# Patient Record
Sex: Female | Born: 1980 | Race: White | Hispanic: No | Smoking: Former smoker
Health system: Southern US, Community
[De-identification: ages and names within clinical notes are randomized; demographics above are authoritative.]

## PROBLEM LIST (undated history)

## (undated) DIAGNOSIS — J42 Unspecified chronic bronchitis: Secondary | ICD-10-CM

## (undated) DIAGNOSIS — I951 Orthostatic hypotension: Secondary | ICD-10-CM

## (undated) DIAGNOSIS — G2581 Restless legs syndrome: Secondary | ICD-10-CM

## (undated) DIAGNOSIS — R51 Headache: Secondary | ICD-10-CM

## (undated) DIAGNOSIS — R519 Headache, unspecified: Secondary | ICD-10-CM

## (undated) DIAGNOSIS — T8859XA Other complications of anesthesia, initial encounter: Secondary | ICD-10-CM

## (undated) DIAGNOSIS — R112 Nausea with vomiting, unspecified: Secondary | ICD-10-CM

## (undated) DIAGNOSIS — T4145XA Adverse effect of unspecified anesthetic, initial encounter: Secondary | ICD-10-CM

## (undated) DIAGNOSIS — D649 Anemia, unspecified: Secondary | ICD-10-CM

## (undated) DIAGNOSIS — G4700 Insomnia, unspecified: Secondary | ICD-10-CM

## (undated) DIAGNOSIS — J45909 Unspecified asthma, uncomplicated: Secondary | ICD-10-CM

## (undated) DIAGNOSIS — Z8489 Family history of other specified conditions: Secondary | ICD-10-CM

## (undated) DIAGNOSIS — G610 Guillain-Barre syndrome: Secondary | ICD-10-CM

## (undated) DIAGNOSIS — Z9889 Other specified postprocedural states: Secondary | ICD-10-CM

## (undated) DIAGNOSIS — R0600 Dyspnea, unspecified: Secondary | ICD-10-CM

## (undated) DIAGNOSIS — H47333 Pseudopapilledema of optic disc, bilateral: Secondary | ICD-10-CM

## (undated) DIAGNOSIS — K219 Gastro-esophageal reflux disease without esophagitis: Secondary | ICD-10-CM

## (undated) DIAGNOSIS — M1990 Unspecified osteoarthritis, unspecified site: Secondary | ICD-10-CM

## (undated) HISTORY — PX: COLONOSCOPY: SHX174

## (undated) HISTORY — PX: FINGER FRACTURE SURGERY: SHX638

## (undated) HISTORY — PX: OTHER SURGICAL HISTORY: SHX169

---
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
## 2003-01-13 HISTORY — PX: KNEE ARTHROSCOPY: SUR90

## 2003-02-17 DIAGNOSIS — G610 Guillain-Barre syndrome: Secondary | ICD-10-CM

## 2003-02-17 HISTORY — DX: Guillain-Barre syndrome: G61.0

## 2007-01-13 HISTORY — PX: CHOLECYSTECTOMY: SHX55

## 2008-01-13 HISTORY — PX: TUBAL LIGATION: SHX77

## 2012-11-22 ENCOUNTER — Inpatient Hospital Stay: Payer: Self-pay | Admitting: Internal Medicine

## 2012-11-22 LAB — CBC
HCT: 38.9 % (ref 35.0–47.0)
HGB: 13.4 g/dL (ref 12.0–16.0)
Platelet: 268 10*3/uL (ref 150–440)
RBC: 4.48 10*6/uL (ref 3.80–5.20)
RDW: 13.4 % (ref 11.5–14.5)

## 2012-11-22 LAB — COMPREHENSIVE METABOLIC PANEL
Albumin: 3.5 g/dL (ref 3.4–5.0)
Anion Gap: 6 — ABNORMAL LOW (ref 7–16)
Calcium, Total: 9.2 mg/dL (ref 8.5–10.1)
Co2: 22 mmol/L (ref 21–32)
Creatinine: 0.68 mg/dL (ref 0.60–1.30)
EGFR (African American): >60
EGFR (Non-African Amer.): >60
Glucose: 103 mg/dL — ABNORMAL HIGH (ref 65–99)
Osmolality: 272 (ref 275–301)
Potassium: 3.9 mmol/L (ref 3.5–5.1)
SGOT(AST): 24 U/L (ref 15–37)

## 2012-11-23 LAB — CBC WITH DIFFERENTIAL/PLATELET
Basophil #: 0 10*3/uL (ref 0.0–0.1)
Basophil %: 0.1 %
Eosinophil %: 0 %
MCH: 29.8 pg (ref 26.0–34.0)
MCHC: 34 g/dL (ref 32.0–36.0)
Monocyte #: 0.2 x10 3/mm (ref 0.2–0.9)
Monocyte %: 0.9 %
Neutrophil %: 94.5 %
RBC: 4.47 10*6/uL (ref 3.80–5.20)
RDW: 13.4 % (ref 11.5–14.5)
WBC: 17.4 10*3/uL — ABNORMAL HIGH (ref 3.6–11.0)

## 2012-11-23 LAB — BASIC METABOLIC PANEL
Anion Gap: 10 (ref 7–16)
Chloride: 108 mmol/L — ABNORMAL HIGH (ref 98–107)
EGFR (African American): >60
Glucose: 131 mg/dL — ABNORMAL HIGH (ref 65–99)
Osmolality: 276 (ref 275–301)
Sodium: 138 mmol/L (ref 136–145)

## 2012-11-23 LAB — RAPID INFLUENZA A&B ANTIGENS

## 2013-01-10 ENCOUNTER — Emergency Department: Payer: Self-pay | Admitting: Emergency Medicine

## 2013-08-09 ENCOUNTER — Emergency Department: Payer: Self-pay | Admitting: Internal Medicine

## 2013-12-18 ENCOUNTER — Emergency Department: Payer: Self-pay | Admitting: Emergency Medicine

## 2014-05-02 ENCOUNTER — Ambulatory Visit
Admit: 2014-05-02 | Disposition: A | Discharge status: Home / Self Care | Payer: Self-pay | Attending: Orthopedic Surgery | Admitting: Orthopedic Surgery

## 2014-05-04 NOTE — Discharge Summary (Signed)
PATIENT NAME:  Lisa Sharp, Mariadelcarmen E MR#:  [id number] DATE OF BIRTH:  Dec 07, 1980  DATE OF ADMISSION:  11/22/2012 DATE OF DISCHARGE:  11/26/2012  PRIMARY CARE PHYSICIAN: At the [facility].   FINAL DIAGNOSES: 1.  Acute respiratory failure, improved.  2.  Asthmatic bronchitis.  3.  Palpitations.  4.  Tobacco abuse.   MEDICATIONS ON DISCHARGE: Include Zantac 150 mg daily, albuterol/ipratropium 3 mL nebulizer every 6 hours as needed for shortness of breath, albuterol CFC 2 puffs 4 times a day as needed; prednisone 10 mg 4 tablets day 1, 3 tablets day 2, 2 tablets day 3, 1 tablet day 4 and 5, 1/2 tablets day 6 and 7; Flovent CFC 1 inhalation twice a day, 100 mcg per inhalations; Levaquin 500 mg 1 tablet every 24 hours for 2 more days, then stop.   HOME OXYGEN: No.   DIET: Regular, regular consistency.   ACTIVITY: As tolerated.   FOLLOWUP: In 1 to 2 weeks at the [facility].   HOSPITAL COURSE: The patient was admitted November 22, 2012, discharged November 26, 2012. The patient came in with shortness of breath, was admitted with acute respiratory failure and asthma exacerbation, started on IV Levaquin, IV Solu-Medrol, and nebulizer treatments.   Laboratory and radiological data during the hospital course included an EKG that showed normal sinus rhythm, no acute ST-T wave changes. Troponin negative. White blood cell count 16.2, hemoglobin and hematocrit 13.4 and 38.9, platelet count of 268. Glucose 103, BUN 8, creatinine 0.68, sodium 137, potassium 3.9, chloride 109, CO2 of 22, calcium 9.2. Liver function tests: Normal range. Chest x-ray negative. ABG showed a pH of 7.41, pCO2 of 32, pO2 of 58, bicarbonate 20.3, O2 saturation 91.6. EKG showed a sinus tachycardia. Magnesium 1.8. Influenza A and B were negative.   HOSPITAL COURSE PER PROBLEM LIST:  1.  For the patient's acute respiratory failure, the patient's lungs upon discharge did show a slight expiratory wheeze in the left base. The rest of  the lung fields were clear. The patient was moving better air than on presentation. The patient was off oxygen, and pulse oximetry held at 92% with exertion on room air. The patient had improved. The patient did require oxygen through the previous hospital course.  2.  For the patient's asthmatic bronchitis, the patient was started on IV Levaquin and high-dose Solu-Medrol and nebulizer treatments. The patient will finish up the course of Levaquin, prednisone taper, and started on Flovent.  3.  Palpitations, likely secondary to difficulty breathing.  4.  Tobacco abuse. The patient was counseled on admission about smoking cessation.   TIME SPENT ON DISCHARGE: 40 minutes.   ____________________________ Herschell Dimesichard J. Renae GlossWieting, MD rjw:jcm D: 11/26/2012 14:00:00 ET T: 11/26/2012 19:51:17 ET JOB#: [id number]  cc: Herschell Dimesichard J. Renae GlossWieting, MD, <Dictator> [facility]   Salley ScarletICHARD J Shamia Uppal MD ELECTRONICALLY SIGNED 11/29/2012 18:53

## 2014-05-04 NOTE — H&P (Signed)
PATIENT NAME:  Lisa Sharp, Cameron E MR#:  [id number] DATE OF BIRTH:  Aug 30, 1980  DATE OF ADMISSION:  11/22/2012  REFERRING PHYSICIAN: Dr. Sharma CovertNorman.   PRIMARY CARE PHYSICIAN: None.   CHIEF COMPLAINT: Shortness of breath.   HISTORY OF PRESENT ILLNESS: The patient is a pleasant, obese 34 year old with chronic bronchitis and history of asthma who has not followed up with a physician here for a while. She has run out of most medications. The patient previously lived in [place] and, as she has no insurance, used to get some samples from her previous clinic.   For the last couple of weeks, she has been having upper respiratory symptoms including sinus pressure or cough. The last 3 days, she has been having some chills and progressive shortness of breath and some dizziness. She has a daughter and father who have similar symptoms. She has had significant dry cough. It feels wet but she does not bring anything up.   She came into the hospital and she was found to be extensively wheezing. She has been hypoxic with ambulation and she is very dyspneic. Sats have been in the mid 80s with ambulation per ER physician. She appears to have no pneumonia on x-ray of the chest, and hospitalist services were contacted for further evaluation and management.   PAST MEDICAL HISTORY:  1.  Asthma and chronic bronchitis.  2.  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   PAST SURGICAL HISTORY: Left knee surgery, gallbladder taken out, tubal ligation.   ALLERGIES: ASPIRIN.   FAMILY HISTORY: Multiple, including asthma, emphysema, cancers, hypertension.   SOCIAL HISTORY: Smokes a pack a day. Occasional alcohol. No drugs. Is a Consulting civil engineerstudent.   OUTPATIENT MEDICATIONS: She has run out of most of her medications, but she takes albuterol p.r.n., nebs p.r.n. and Zantac 150 mg daily. She is not taking Advair or steroids as they have run out.   REVIEW OF SYSTEMS:   CONSTITUTIONAL: Positive for chills. Weight is steady.  EYES: No blurry vision or double vision.  ENT: No tinnitus. Positive for sore throat from all the coughing. Has no decreased hearing or postnasal drip.  RESPIRATORY: Positive for dry cough, wheezing, shortness of breath and dyspnea on exertion. Has a history of asthma and chronic bronchitis.  CARDIOVASCULAR: Denies chest pain or swelling in the legs, but she does feel more short of breath when she is lying flat.  GASTROINTESTINAL: No nausea, vomiting, diarrhea, abdominal pain, or hematemesis.  GENITOURINARY: Denies dysuria or hematuria.  HEMOLYMPHATIC: No anemia or easy bruising.  SKIN: No rashes.  MUSCULOSKELETAL: Denies arthritis or gout.  NEUROLOGIC: Has history of Guillain-Barre. PSYCHIATRIC: No anxiety or insomnia.   PHYSICAL EXAMINATION:  VITAL SIGNS: Temperature on arrival 98.1, pulse rate 93, respiratory rate 24, blood pressure 108/75, oxygen saturation 95% on room air when sitting still, but it dropped to mid-80s or so to high 80s with ambulation or any minimal exertion.  GENERAL: An obese female sitting in bed, mildly short of breath.  HEENT: Normocephalic, atraumatic. Pupils are equal and reactive. Anicteric sclerae. Extraocular muscles intact. Moist mucous membranes.  NECK: Supple. No thyroid tenderness. No cervical lymphadenopathy.  CARDIOVASCULAR: S1, S2. Irregularly irregular. No murmurs, rubs or gallops.  LUNGS: Bilateral diffuse wheezing and decreased breath sounds bilaterally, decreased air entry.  ABDOMEN: Soft, nontender, nondistended, obese. No organomegaly appreciated.  EXTREMITIES: No significant lower extremity edema.  SKIN: No obvious rashes.  NEUROLOGIC: Cranial nerves II through XII grossly  intact. Strength is five out of five in all extremities. Sensation intact to light touch.  PSYCHIATRIC: Awake, alert, oriented x3.   LABORATORIES: Glucose 103, BUN 8, creatinine 0.68, sodium 137, potassium 3.9. LFTs  within normal limits. Troponin negative. White count 16.2, hemoglobin 13.4, platelets 268. ABG showed pH of 7.41, pCO2 of 32, pO2 of 58 on room air. X-ray of the chest, PA and lateral: No acute cardiopulmonary disease of the chest.   EKG: Sinus rhythm. Rate is 88. No acute ST elevations or depressions.   ASSESSMENT AND PLAN: We have a 34 year old female with history of asthma and chronic bronchitis, who is not on any inhaled steroids, who presents with acute respiratory failure and systemic inflammatory response syndrome, likely from asthma exacerbation and acute bronchitis. At this point, we will admit the patient to the hospital. The patient did desaturate to 85% on room air with slight ambulation with tachypnea, rate of 30s, and requires oxygen at this point. I would start her on standing IV steroids, standing nebulizers, oxygen, and for the acute bronchitis, start her on Levaquin, obtain sputum cultures. The patient does have a couple of sick contacts. I will go ahead and send in rapid flu, but she has had no significant fevers.   At this point, we will also start her on some Robitussin with codeine and Tylenol p.r.n. for her symptoms of coughing. The patient did have leukocytosis, tachycardia, and tachypnea, and no pneumonia.   I suspect she has acute bronchitis. She does still smoke tobacco and she was counseled for 3 minutes. She refuses a patch at this time and I do not know if she is going to stop smoking, but she was strongly encouraged by me.   We would monitor her white count and her symptoms and also add p.r.n. nebulizers in case she needs it.   TOTAL TIME SPENT: 50 minutes.   CODE STATUS: FULL CODE.   ____________________________ Krystal Eaton, MD sa:np D: 11/22/2012 16:17:37 ET T: 11/22/2012 17:15:28 ET JOB#: [id number]  cc: Krystal Eaton, MD, <Dictator> Krystal Eaton MD ELECTRONICALLY SIGNED 12/06/2012 15:27

## 2014-06-06 ENCOUNTER — Other Ambulatory Visit: Payer: Self-pay

## 2014-06-08 DIAGNOSIS — K219 Gastro-esophageal reflux disease without esophagitis: Secondary | ICD-10-CM | POA: Diagnosis not present

## 2014-06-08 DIAGNOSIS — M67432 Ganglion, left wrist: Secondary | ICD-10-CM | POA: Diagnosis not present

## 2014-06-08 DIAGNOSIS — H47333 Pseudopapilledema of optic disc, bilateral: Secondary | ICD-10-CM | POA: Diagnosis not present

## 2014-06-08 DIAGNOSIS — Z800 Family history of malignant neoplasm of digestive organs: Secondary | ICD-10-CM | POA: Diagnosis not present

## 2014-06-08 DIAGNOSIS — Z79899 Other long term (current) drug therapy: Secondary | ICD-10-CM | POA: Diagnosis not present

## 2014-06-08 DIAGNOSIS — Z888 Allergy status to other drugs, medicaments and biological substances status: Secondary | ICD-10-CM | POA: Diagnosis not present

## 2014-06-08 DIAGNOSIS — Z8261 Family history of arthritis: Secondary | ICD-10-CM | POA: Diagnosis not present

## 2014-06-08 DIAGNOSIS — Z7951 Long term (current) use of inhaled steroids: Secondary | ICD-10-CM | POA: Diagnosis not present

## 2014-06-08 DIAGNOSIS — M6740 Ganglion, unspecified site: Secondary | ICD-10-CM | POA: Diagnosis present

## 2014-06-08 DIAGNOSIS — Z886 Allergy status to analgesic agent status: Secondary | ICD-10-CM | POA: Diagnosis not present

## 2014-06-08 DIAGNOSIS — Z8042 Family history of malignant neoplasm of prostate: Secondary | ICD-10-CM | POA: Diagnosis not present

## 2014-06-08 DIAGNOSIS — Z9049 Acquired absence of other specified parts of digestive tract: Secondary | ICD-10-CM | POA: Diagnosis not present

## 2014-06-08 DIAGNOSIS — J45909 Unspecified asthma, uncomplicated: Secondary | ICD-10-CM | POA: Diagnosis not present

## 2014-06-08 DIAGNOSIS — Z818 Family history of other mental and behavioral disorders: Secondary | ICD-10-CM | POA: Diagnosis not present

## 2014-06-08 DIAGNOSIS — G43909 Migraine, unspecified, not intractable, without status migrainosus: Secondary | ICD-10-CM | POA: Diagnosis not present

## 2014-06-08 DIAGNOSIS — Z823 Family history of stroke: Secondary | ICD-10-CM | POA: Diagnosis not present

## 2014-06-08 DIAGNOSIS — Z833 Family history of diabetes mellitus: Secondary | ICD-10-CM | POA: Diagnosis not present

## 2014-06-08 DIAGNOSIS — Z803 Family history of malignant neoplasm of breast: Secondary | ICD-10-CM | POA: Diagnosis not present

## 2014-06-08 DIAGNOSIS — Z8489 Family history of other specified conditions: Secondary | ICD-10-CM | POA: Diagnosis not present

## 2014-06-08 DIAGNOSIS — Z8669 Personal history of other diseases of the nervous system and sense organs: Secondary | ICD-10-CM | POA: Diagnosis not present

## 2014-06-08 DIAGNOSIS — Z808 Family history of malignant neoplasm of other organs or systems: Secondary | ICD-10-CM | POA: Diagnosis not present

## 2014-06-08 DIAGNOSIS — Z8249 Family history of ischemic heart disease and other diseases of the circulatory system: Secondary | ICD-10-CM | POA: Diagnosis not present

## 2014-06-08 NOTE — Patient Instructions (Signed)
  Your procedure is scheduled on: Jun 12, 2014 Report to Same Day Surgery To find out your arrival time please call [phone] between 1PM - 3PM on today.  Remember: Instructions that are not followed completely may result in serious medical risk, up to and including death, or upon the discretion of your surgeon and anesthesiologist your surgery may need to be rescheduled.    __x__ 1. Do not eat food or drink liquids after midnight. No gum chewing or hard candies.     __x__ 2. No Alcohol for 24 hours before or after surgery.   ____ 3. Bring all medications with you on the day of surgery if instructed.    __x__ 4. Notify your doctor if there is any change in your medical condition     (cold, fever, infections).     Do not wear jewelry, make-up, hairpins, clips or nail polish.  Do not wear lotions, powders, or perfumes. You may wear deodorant.  Do not shave 48 hours prior to surgery. Men may shave face and neck.  Do not bring valuables to the hospital.    [facility] is not responsible for any belongings or valuables.               Contacts, dentures or bridgework may not be worn into surgery.  Leave your suitcase in the car. After surgery it may be brought to your room.  For patients admitted to the hospital, discharge time is determined by your  treatment team.   Patients discharged the day of surgery will not be allowed to drive home.   Please read over the following fact sheets that you were given:      __x__ Take these medicines the morning of surgery with A SIP OF WATER:    1. Gabapentin  2. omeprazole (PRILOSEC)   ____ Fleet Enema (as directed)   ____ Use CHG Soap as directed  _x___ Use inhalers on the day of surgery  ____ Stop metformin 2 days prior to surgery    ____ Take 1/2 of usual insulin dose the night before surgery and none on the morning of surgery.   ____ Stop Coumadin/Plavix/aspirin on does not apply  _x___ Stop Anti-inflammatories on May 29, 2014.  Tylenol OK for pain.   ____ Stop supplements until after surgery.    ____ Bring C-Pap to the hospital.

## 2014-06-12 ENCOUNTER — Ambulatory Visit: Payer: BLUE CROSS/BLUE SHIELD | Admitting: Certified Registered Nurse Anesthetist

## 2014-06-12 ENCOUNTER — Encounter
Admission: RE | Disposition: A | Discharge status: Home / Self Care | Payer: Self-pay | Source: Ambulatory Visit | Attending: Orthopedic Surgery

## 2014-06-12 ENCOUNTER — Encounter: Payer: Self-pay | Admitting: *Deleted

## 2014-06-12 ENCOUNTER — Ambulatory Visit
Admission: RE | Admit: 2014-06-12 | Discharge: 2014-06-12 | Disposition: A | Discharge status: Home / Self Care | Payer: BLUE CROSS/BLUE SHIELD | Source: Ambulatory Visit | Attending: Orthopedic Surgery | Admitting: Orthopedic Surgery

## 2014-06-12 DIAGNOSIS — Z833 Family history of diabetes mellitus: Secondary | ICD-10-CM | POA: Insufficient documentation

## 2014-06-12 DIAGNOSIS — Z803 Family history of malignant neoplasm of breast: Secondary | ICD-10-CM | POA: Insufficient documentation

## 2014-06-12 DIAGNOSIS — Z886 Allergy status to analgesic agent status: Secondary | ICD-10-CM | POA: Insufficient documentation

## 2014-06-12 DIAGNOSIS — G43909 Migraine, unspecified, not intractable, without status migrainosus: Secondary | ICD-10-CM | POA: Insufficient documentation

## 2014-06-12 DIAGNOSIS — Z8249 Family history of ischemic heart disease and other diseases of the circulatory system: Secondary | ICD-10-CM | POA: Insufficient documentation

## 2014-06-12 DIAGNOSIS — Z888 Allergy status to other drugs, medicaments and biological substances status: Secondary | ICD-10-CM | POA: Insufficient documentation

## 2014-06-12 DIAGNOSIS — Z7951 Long term (current) use of inhaled steroids: Secondary | ICD-10-CM | POA: Insufficient documentation

## 2014-06-12 DIAGNOSIS — Z79899 Other long term (current) drug therapy: Secondary | ICD-10-CM | POA: Insufficient documentation

## 2014-06-12 DIAGNOSIS — J45909 Unspecified asthma, uncomplicated: Secondary | ICD-10-CM | POA: Insufficient documentation

## 2014-06-12 DIAGNOSIS — Z808 Family history of malignant neoplasm of other organs or systems: Secondary | ICD-10-CM | POA: Insufficient documentation

## 2014-06-12 DIAGNOSIS — Z823 Family history of stroke: Secondary | ICD-10-CM | POA: Insufficient documentation

## 2014-06-12 DIAGNOSIS — M67432 Ganglion, left wrist: Secondary | ICD-10-CM | POA: Insufficient documentation

## 2014-06-12 DIAGNOSIS — Z8669 Personal history of other diseases of the nervous system and sense organs: Secondary | ICD-10-CM | POA: Insufficient documentation

## 2014-06-12 DIAGNOSIS — K219 Gastro-esophageal reflux disease without esophagitis: Secondary | ICD-10-CM | POA: Insufficient documentation

## 2014-06-12 DIAGNOSIS — Z8042 Family history of malignant neoplasm of prostate: Secondary | ICD-10-CM | POA: Insufficient documentation

## 2014-06-12 DIAGNOSIS — Z800 Family history of malignant neoplasm of digestive organs: Secondary | ICD-10-CM | POA: Insufficient documentation

## 2014-06-12 DIAGNOSIS — Z9049 Acquired absence of other specified parts of digestive tract: Secondary | ICD-10-CM | POA: Insufficient documentation

## 2014-06-12 DIAGNOSIS — Z818 Family history of other mental and behavioral disorders: Secondary | ICD-10-CM | POA: Insufficient documentation

## 2014-06-12 DIAGNOSIS — Z8261 Family history of arthritis: Secondary | ICD-10-CM | POA: Insufficient documentation

## 2014-06-12 DIAGNOSIS — H47333 Pseudopapilledema of optic disc, bilateral: Secondary | ICD-10-CM | POA: Insufficient documentation

## 2014-06-12 DIAGNOSIS — Z8489 Family history of other specified conditions: Secondary | ICD-10-CM | POA: Insufficient documentation

## 2014-06-12 HISTORY — PX: GANGLION CYST EXCISION: SHX1691

## 2014-06-12 HISTORY — DX: Other specified postprocedural states: Z98.890

## 2014-06-12 HISTORY — DX: Other complications of anesthesia, initial encounter: T88.59XA

## 2014-06-12 HISTORY — DX: Headache, unspecified: R51.9

## 2014-06-12 HISTORY — DX: Other specified postprocedural states: R11.2

## 2014-06-12 HISTORY — DX: Adverse effect of unspecified anesthetic, initial encounter: T41.45XA

## 2014-06-12 HISTORY — DX: Insomnia, unspecified: G47.00

## 2014-06-12 HISTORY — DX: Restless legs syndrome: G25.81

## 2014-06-12 HISTORY — DX: Unspecified osteoarthritis, unspecified site: M19.90

## 2014-06-12 HISTORY — DX: Family history of other specified conditions: Z84.89

## 2014-06-12 HISTORY — DX: Guillain-Barre syndrome: G61.0

## 2014-06-12 HISTORY — DX: Unspecified asthma, uncomplicated: J45.909

## 2014-06-12 HISTORY — DX: Pseudopapilledema of optic disc, bilateral: H47.333

## 2014-06-12 HISTORY — DX: Gastro-esophageal reflux disease without esophagitis: K21.9

## 2014-06-12 HISTORY — DX: Unspecified chronic bronchitis: J42

## 2014-06-12 HISTORY — DX: Headache: R51

## 2014-06-12 HISTORY — DX: Orthostatic hypotension: I95.1

## 2014-06-12 LAB — POCT PREGNANCY, URINE: PREG TEST UR: NEGATIVE

## 2014-06-12 SURGERY — EXCISION, GANGLION CYST, WRIST
Anesthesia: General | Laterality: Left | Wound class: Clean

## 2014-06-12 MED ORDER — ONDANSETRON HCL 4 MG PO TABS: 4.0000 mg | ORAL_TABLET | Freq: Four times a day (QID) | ORAL | Status: DC | PRN

## 2014-06-12 MED ORDER — ONDANSETRON HCL 4 MG/2ML IJ SOLN
4.0000 mg | Freq: Once | INTRAMUSCULAR | Status: AC | PRN
  Administered 2014-06-12: 4 mg via INTRAVENOUS

## 2014-06-12 MED ORDER — FENTANYL CITRATE (PF) 100 MCG/2ML IJ SOLN
25.0000 ug | INTRAMUSCULAR | Status: DC | PRN
  Administered 2014-06-12 (×4): 25 ug via INTRAVENOUS

## 2014-06-12 MED ORDER — ROCURONIUM BROMIDE 100 MG/10ML IV SOLN
INTRAVENOUS | Status: DC | PRN
  Administered 2014-06-12: 30 mg via INTRAVENOUS
  Administered 2014-06-12: 10 mg via INTRAVENOUS

## 2014-06-12 MED ORDER — IPRATROPIUM-ALBUTEROL 0.5-2.5 (3) MG/3ML IN SOLN
RESPIRATORY_TRACT | Status: AC
  Administered 2014-06-12: 3 mL via RESPIRATORY_TRACT
  Filled 2014-06-12: qty 3

## 2014-06-12 MED ORDER — ONDANSETRON HCL 4 MG/2ML IJ SOLN
INTRAMUSCULAR | Status: DC | PRN
  Administered 2014-06-12: 4 mg via INTRAVENOUS

## 2014-06-12 MED ORDER — BUPIVACAINE HCL (PF) 0.5 % IJ SOLN
INTRAMUSCULAR | Status: AC
  Filled 2014-06-12: qty 30

## 2014-06-12 MED ORDER — ALBUTEROL SULFATE HFA 108 (90 BASE) MCG/ACT IN AERS
INHALATION_SPRAY | RESPIRATORY_TRACT | Status: DC | PRN
  Administered 2014-06-12: 2 via RESPIRATORY_TRACT

## 2014-06-12 MED ORDER — PROPOFOL 10 MG/ML IV BOLUS
INTRAVENOUS | Status: DC | PRN
Start: 2014-06-12 — End: 2014-06-12
  Administered 2014-06-12: 200 mg via INTRAVENOUS

## 2014-06-12 MED ORDER — LACTATED RINGERS IV SOLN
INTRAVENOUS | Status: DC
  Administered 2014-06-12 (×2): via INTRAVENOUS

## 2014-06-12 MED ORDER — SUGAMMADEX SODIUM 200 MG/2ML IV SOLN
INTRAVENOUS | Status: DC | PRN
  Administered 2014-06-12: 200 mg via INTRAVENOUS

## 2014-06-12 MED ORDER — IPRATROPIUM-ALBUTEROL 0.5-2.5 (3) MG/3ML IN SOLN
3.0000 mL | Freq: Once | RESPIRATORY_TRACT | Status: AC
  Administered 2014-06-12: 3 mL via RESPIRATORY_TRACT

## 2014-06-12 MED ORDER — MIDAZOLAM HCL 2 MG/2ML IJ SOLN
INTRAMUSCULAR | Status: DC | PRN
  Administered 2014-06-12: 2 mg via INTRAVENOUS

## 2014-06-12 MED ORDER — FENTANYL CITRATE (PF) 100 MCG/2ML IJ SOLN
INTRAMUSCULAR | Status: DC | PRN
  Administered 2014-06-12: 100 ug via INTRAVENOUS

## 2014-06-12 MED ORDER — SODIUM CHLORIDE 0.9 % IV SOLN: INTRAVENOUS | Status: DC

## 2014-06-12 MED ORDER — HYDROCODONE-ACETAMINOPHEN 5-325 MG PO TABS
ORAL_TABLET | ORAL | Status: AC
  Filled 2014-06-12: qty 1

## 2014-06-12 MED ORDER — ONDANSETRON 4 MG PO TBDP: 4.0000 mg | ORAL_TABLET | Freq: Three times a day (TID) | ORAL | Status: DC | PRN

## 2014-06-12 MED ORDER — PROMETHAZINE HCL 25 MG/ML IJ SOLN
6.2500 mg | Freq: Once | INTRAMUSCULAR | Status: AC
  Administered 2014-06-12: 6.25 mg via INTRAVENOUS

## 2014-06-12 MED ORDER — PROMETHAZINE HCL 25 MG/ML IJ SOLN
INTRAMUSCULAR | Status: AC
  Administered 2014-06-12: 6.25 mg via INTRAVENOUS
  Filled 2014-06-12: qty 1

## 2014-06-12 MED ORDER — BUPIVACAINE HCL 0.5 % IJ SOLN
INTRAMUSCULAR | Status: DC | PRN
  Administered 2014-06-12: 10 mL

## 2014-06-12 MED ORDER — HYDROCODONE-ACETAMINOPHEN 5-325 MG PO TABS
ORAL_TABLET | ORAL | Status: AC
  Administered 2014-06-12: 1 via ORAL
  Filled 2014-06-12: qty 1

## 2014-06-12 MED ORDER — SODIUM CHLORIDE 0.9 % IJ SOLN
INTRAMUSCULAR | Status: AC
  Filled 2014-06-12: qty 10

## 2014-06-12 MED ORDER — ONDANSETRON HCL 4 MG/2ML IJ SOLN
INTRAMUSCULAR | Status: AC
  Administered 2014-06-12: 4 mg via INTRAVENOUS
  Filled 2014-06-12: qty 2

## 2014-06-12 MED ORDER — HYDROCODONE-ACETAMINOPHEN 5-325 MG PO TABS: 1.0000 | ORAL_TABLET | Freq: Four times a day (QID) | ORAL | Status: DC | PRN

## 2014-06-12 MED ORDER — LIDOCAINE HCL (CARDIAC) 20 MG/ML IV SOLN
INTRAVENOUS | Status: DC | PRN
  Administered 2014-06-12: 50 mg via INTRAVENOUS

## 2014-06-12 MED ORDER — SUCCINYLCHOLINE CHLORIDE 20 MG/ML IJ SOLN
INTRAMUSCULAR | Status: DC | PRN
  Administered 2014-06-12: 120 mg via INTRAVENOUS

## 2014-06-12 MED ORDER — ONDANSETRON HCL 4 MG/2ML IJ SOLN: 4.0000 mg | Freq: Four times a day (QID) | INTRAMUSCULAR | Status: DC | PRN

## 2014-06-12 MED ORDER — HYDROCODONE-ACETAMINOPHEN 5-325 MG PO TABS
1.0000 | ORAL_TABLET | ORAL | Status: DC | PRN
  Administered 2014-06-12 (×2): 1 via ORAL

## 2014-06-12 MED ORDER — HYDROCODONE-ACETAMINOPHEN 5-325 MG PO TABS: 1.0000 | ORAL_TABLET | ORAL | Status: DC | PRN

## 2014-06-12 MED ORDER — FENTANYL CITRATE (PF) 100 MCG/2ML IJ SOLN
INTRAMUSCULAR | Status: AC
  Administered 2014-06-12: 25 ug via INTRAVENOUS
  Filled 2014-06-12: qty 2

## 2014-06-12 MED ORDER — DEXAMETHASONE SODIUM PHOSPHATE 4 MG/ML IJ SOLN
INTRAMUSCULAR | Status: DC | PRN
  Administered 2014-06-12: 5 mg via INTRAVENOUS

## 2014-06-12 SURGICAL SUPPLY — 28 items
BANDAGE ELASTIC 3 CLIP NS LF (GAUZE/BANDAGES/DRESSINGS) ×3 IMPLANT
BNDG ESMARK 4X12 TAN STRL LF (GAUZE/BANDAGES/DRESSINGS) IMPLANT
CANISTER SUCT 1200ML W/VALVE (MISCELLANEOUS) ×3 IMPLANT
CAST PADDING 3X4FT ST 30246 (SOFTGOODS) ×2
CHLORAPREP W/TINT 26ML (MISCELLANEOUS) ×3 IMPLANT
ELECT CAUTERY NEEDLE 2.0 MIC (NEEDLE) ×3 IMPLANT
GAUZE PETRO XEROFOAM 1X8 (MISCELLANEOUS) ×3 IMPLANT
GAUZE SPONGE 4X4 12PLY STRL (GAUZE/BANDAGES/DRESSINGS) ×3 IMPLANT
GLOVE BIOGEL PI IND STRL 9 (GLOVE) ×1 IMPLANT
GLOVE BIOGEL PI INDICATOR 9 (GLOVE) ×2
GLOVE SURG ORTHO 9.0 STRL STRW (GLOVE) ×3 IMPLANT
GOWN SPECIALTY ULTRA XL (MISCELLANEOUS) ×3 IMPLANT
GOWN STRL REUS W/ TWL LRG LVL3 (GOWN DISPOSABLE) ×1 IMPLANT
GOWN STRL REUS W/TWL LRG LVL3 (GOWN DISPOSABLE) ×2
KIT RM TURNOVER STRD PROC AR (KITS) ×3 IMPLANT
NDL SAFETY 25GX1.5 (NEEDLE) ×3 IMPLANT
NS IRRIG 500ML POUR BTL (IV SOLUTION) ×3 IMPLANT
PACK EXTREMITY ARMC (MISCELLANEOUS) ×3 IMPLANT
PAD CAST CTTN 3X4 STRL (SOFTGOODS) ×1 IMPLANT
SPLINT CAST 1 STEP 3X12 (MISCELLANEOUS) ×3 IMPLANT
STOCKINETTE STRL 4IN 9604848 (GAUZE/BANDAGES/DRESSINGS) ×3 IMPLANT
SUT ETHILON 4-0 (SUTURE) ×2
SUT ETHILON 4-0 FS2 18XMFL BLK (SUTURE) ×1
SUT ETHILON 5-0 FS-2 18 BLK (SUTURE) IMPLANT
SUT MNCRL 4-0 (SUTURE)
SUT MNCRL 4-0 27XMFL (SUTURE)
SUTURE ETHLN 4-0 FS2 18XMF BLK (SUTURE) ×1 IMPLANT
SUTURE MNCRL 4-0 27XMF (SUTURE) IMPLANT

## 2014-06-12 NOTE — Op Note (Signed)
06/12/2014  8:06 AM  PATIENT:  Ethlyn GalleryLeslie E Gutkowski  34 y.o. female  PRE-OPERATIVE DIAGNOSIS:  GANGLION CYST  POST-OPERATIVE DIAGNOSIS:  GANGLION CYST  PROCEDURE:  Procedure(s): REMOVAL GANGLION OF WRIST (Left)  SURGEON: Leitha SchullerMichael J Leopoldo Mazzie, MD  ASSISTANTS: None  ANESTHESIA:   general  EBL:    minimal  BLOOD ADMINISTERED:none  DRAINS: none   LOCAL MEDICATIONS USED:  BUPIVICAINE   SPECIMEN:  Source of Specimen:  Volar wrist ganglion cyst  DISPOSITION OF SPECIMEN:  PATHOLOGY  COUNTS:  YES  TOURNIQUET:   8 minutes at 250 mmHg   IMPLANTS: None  DICTATION: .Dragon Dictation patient was brought to the operating room and after adequate anesthesia was obtained the left arm was prepped and draped in sterile fashion. After patient identification and timeout procedures were completed tourniquet was raised to 250 mmHg incision was made over the FCR tendon and the ganglion and a slight curvilinear approach. The subcutaneous tissue was spread and the ganglion came up to this surface more pain in the proximal extent of the wound this was then tracked distally to the volar radiocarpal joint. The stalk of the ganglion was then cut at the level of the joint, and ganglion cyst wall removed a specimen. Cautery was used around the opening in the joint to try to cause scar tissue and prevent recurrence. At this point the wound was irrigated and tourniquet let down. There is no significant bleeding, radial artery was intact. The wounds closed with simple interrupted 4-0 nylon skin suture. Xeroform 4 x 4's web roll and volar splint were applied with 10 cc of half percent Sensorcaine infiltrated around the incision prior to dressing.  PLAN OF CARE: Discharge to home after PACU  PATIENT DISPOSITION:  PACU - hemodynamically stable.

## 2014-06-12 NOTE — Anesthesia Preprocedure Evaluation (Addendum)
Anesthesia Evaluation  Patient identified by MRN, date of birth, ID band Patient awake    Reviewed: Allergy & Precautions, NPO status , Patient's Chart, lab work & pertinent test results  History of Anesthesia Complications (+) PONV and Family history of anesthesia reaction  Airway Mallampati: II  TM Distance: >3 FB Neck ROM: Full    Dental  (+) Poor Dentition, Chipped   Pulmonary asthma , Current Smoker,    Pulmonary exam normal       Cardiovascular Normal cardiovascular exam    Neuro/Psych  Headaches,  Neuromuscular disease    GI/Hepatic GERD-  Medicated and Controlled,  Endo/Other    Renal/GU      Musculoskeletal  (+) Arthritis -, Osteoarthritis,    Abdominal (+) + obese,   Peds  Hematology   Anesthesia Other Findings   Reproductive/Obstetrics                            Anesthesia Physical Anesthesia Plan  ASA: III  Anesthesia Plan: General   Post-op Pain Management:    Induction: Intravenous  Airway Management Planned: Oral ETT  Additional Equipment:   Intra-op Plan:   Post-operative Plan: Extubation in OR  Informed Consent: I have reviewed the patients History and Physical, chart, labs and discussed the procedure including the risks, benefits and alternatives for the proposed anesthesia with the patient or authorized representative who has indicated his/her understanding and acceptance.   Dental advisory given  Plan Discussed with: CRNA and Surgeon  Anesthesia Plan Comments:         Anesthesia Quick Evaluation

## 2014-06-12 NOTE — Transfer of Care (Signed)
Immediate Anesthesia Transfer of Care Note  Patient: Lisa Sharp  Procedure(s) Performed: Procedure(s): REMOVAL GANGLION OF WRIST (Left)  Patient Location: PACU  Anesthesia Type:General  Level of Consciousness: Alert, Awake, Oriented  Airway & Oxygen Therapy: Patient Spontanous Breathing, Reactive airway, DUONEB gioven in PACU per Dr. Noralyn Pickarroll order  Post-op Assessment: Report given to RN  Post vital signs: Reviewed and stable  Last Vitals:  Filed Vitals:   06/12/14 0818  BP: 127/88  Pulse: 94  Temp: 36.2 C  Resp: 21    Complications: No apparent anesthesia complications

## 2014-06-12 NOTE — Discharge Instructions (Addendum)
Keep arm elevated over the next 2-3 days. Local anesthesia was placed around her incision, there may be numbness into the hand for several hours. Loosen Ace wrap if fingers swellGeneral Anesthesia, Care After Refer to this sheet in the next few weeks. These instructions provide you with information on caring for yourself after your procedure. Your health care provider may also give you more specific instructions. Your treatment has been planned according to current medical practices, but problems sometimes occur. Call your health care provider if you have any problems or questions after your procedure. WHAT TO EXPECT AFTER THE PROCEDURE After the procedure, it is typical to experience:  Sleepiness.  Nausea and vomiting. HOME CARE INSTRUCTIONS  For the first 24 hours after general anesthesia:  Have a responsible person with you.  Do not drive a car. If you are alone, do not take public transportation.  Do not drink alcohol.  Do not take medicine that has not been prescribed by your health care provider.  Do not sign important papers or make important decisions.  You may resume a normal diet and activities as directed by your health care provider.  Change bandages (dressings) as directed.  If you have questions or problems that seem related to general anesthesia, call the hospital and ask for the anesthetist or anesthesiologist on call. SEEK MEDICAL CARE IF:  You have nausea and vomiting that continue the day after anesthesia.  You develop a rash. SEEK IMMEDIATE MEDICAL CARE IF:   You have difficulty breathing.  You have chest pain.  You have any allergic problems. Document Released: 04/06/2000 Document Revised: 01/03/2013 Document Reviewed: 07/14/2012 [facility] Patient Information 2015 [place], [place]. This information is not intended to replace advice given to you by your health care provider. Make sure you discuss any questions you have with your health care  provider. Ganglion Cyst A ganglion cyst is a noncancerous, fluid-filled lump that occurs near joints or tendons. The ganglion cyst grows out of a joint or the lining of a tendon. It most often develops in the hand or wrist but can also develop in the shoulder, elbow, hip, knee, ankle, or foot. The round or oval ganglion can be pea sized or larger than a grape. Increased activity may enlarge the size of the cyst because more fluid starts to build up.  CAUSES  It is not completely known what causes a ganglion cyst to grow. However, it may be related to:  Inflammation or irritation around the joint.  An injury.  Repetitive movements or overuse.  Arthritis. SYMPTOMS  A lump most often appears in the hand or wrist, but can occur in other areas of the body. Generally, the lump is painless without other symptoms. However, sometimes pain can be felt during activity or when pressure is applied to the lump. The lump may even be tender to the touch. Tingling, pain, numbness, or muscle weakness can occur if the ganglion cyst presses on a nerve. Your grip may be weak and you may have less movement in your joints.  DIAGNOSIS  Ganglion cysts are most often diagnosed based on a physical exam, noting where the cyst is and how it looks. Your caregiver will feel the lump and may shine a light alongside it. If it is a ganglion, a light often shines through it. Your caregiver may order an X-ray, ultrasound, or MRI to rule out other conditions. TREATMENT  Ganglions usually go away on their own without treatment. If pain or other symptoms are involved, treatment may  be needed. Treatment is also needed if the ganglion limits your movement or if it gets infected. Treatment options include:  Wearing a wrist or finger brace or splint.  Taking anti-inflammatory medicine.  Draining fluid from the lump with a needle (aspiration).  Injecting a steroid into the joint.  Surgery to remove the ganglion cyst and its stalk  that is attached to the joint or tendon. However, ganglion cysts can grow back. HOME CARE INSTRUCTIONS   Do not press on the ganglion, poke it with a needle, or hit it with a heavy object. You may rub the lump gently and often. Sometimes fluid moves out of the cyst.  Only take medicines as directed by your caregiver.  Wear your brace or splint as directed by your caregiver. SEEK MEDICAL CARE IF:   Your ganglion becomes larger or more painful.  You have increased redness, red streaks, or swelling.  You have pus coming from the lump.  You have weakness or numbness in the affected area. MAKE SURE YOU:   Understand these instructions.  Will watch your condition.  Will get help right away if you are not doing well or get worse. Document Released: 12/27/1999 Document Revised: 09/23/2011 Document Reviewed: 02/22/2007 [facility] Patient Information 2015 [place]. This information is not intended to replace advice given to you by your health care provider. Make sure you discuss any questions you have with your health care provider.

## 2014-06-12 NOTE — Anesthesia Procedure Notes (Signed)
Procedure Name: Intubation Date/Time: 06/12/2014 7:21 AM Performed by: Sherron FlemingsHARVEY, Clarice Zulauf Pre-anesthesia Checklist: Patient identified, Patient being monitored, Timeout performed, Emergency Drugs available and Suction available Patient Re-evaluated:Patient Re-evaluated prior to inductionOxygen Delivery Method: Circle system utilized Preoxygenation: Pre-oxygenation with 100% oxygen Intubation Type: IV induction and Rapid sequence Ventilation: Mask ventilation without difficulty Laryngoscope Size: Mac and 3 Grade View: Grade II Tube type: Oral Tube size: 7.5 mm Number of attempts: 1 Placement Confirmation: ETT inserted through vocal cords under direct vision,  positive ETCO2 and breath sounds checked- equal and bilateral Secured at: 21 cm Tube secured with: Tape Dental Injury: Teeth and Oropharynx as per pre-operative assessment  Future Recommendations: Recommend- induction with short-acting agent, and alternative techniques readily available

## 2014-06-12 NOTE — Anesthesia Postprocedure Evaluation (Signed)
  Anesthesia Post-op Note  Patient: Lisa Sharp  Procedure(s) Performed: Procedure(s): REMOVAL GANGLION OF WRIST (Left)  Anesthesia type:General  Patient location: PACU  Post pain: Pain level controlled  Post assessment: Post-op Vital signs reviewed, Patient's Cardiovascular Status Stable, Respiratory Function Stable, Patent Airway and No signs of Nausea or vomiting  Post vital signs: Reviewed and stable  Last Vitals:  Filed Vitals:   06/12/14 0959  BP: 119/65  Pulse: 83  Temp:   Resp: 16    Level of consciousness: awake, alert  and patient cooperative  Complications: No apparent anesthesia complications

## 2014-06-12 NOTE — H&P (Signed)
Reviewed paper H+P, will be scanned into chart. No changes noted.  

## 2014-06-13 LAB — SURGICAL PATHOLOGY

## 2014-08-15 ENCOUNTER — Encounter: Payer: Self-pay | Admitting: *Deleted

## 2014-08-15 NOTE — Discharge Instructions (Signed)
T & A INSTRUCTION SHEET - [facility] °[facility] ° °CREIGHTON VAUGHT, MD °PAUL H. JUENGEL, MD  °P. SCOTT BENNETT °CHAPMAN MCQUEEN, MD ° °[address] [place], [place] [address] TEL. [phone] °[address] [place] [address] [phone] ° °INFORMATION SHEET FOR A TONSILLECTOMY AND ADENDOIDECTOMY ° °About Your Tonsils and Adenoids ° The tonsils and adenoids are normal body tissues that are part of our immune system.  They normally help to protect us against diseases that may enter our mouth and nose.  However, sometimes the tonsils and/or adenoids become too large and obstruct our breathing, especially at night. °  ° If either of these things happen it helps to remove the tonsils and adenoids in order to become healthier. The operation to remove the tonsils and adenoids is called a tonsillectomy and adenoidectomy. ° °The Location of Your Tonsils and Adenoids ° The tonsils are located in the back of the throat on both side and sit in a cradle of muscles. The adenoids are located in the roof of the mouth, behind the nose, and closely associated with the opening of the Eustachian tube to the ear. ° °Surgery on Tonsils and Adenoids ° A tonsillectomy and adenoidectomy is a short operation which takes about thirty minutes.  This includes being put to sleep and being awakened.  Tonsillectomies and adenoidectomies are performed at [facility] and may require observation period in the recovery room prior to going home. ° °Following the Operation for a Tonsillectomy ° A cautery machine is used to control bleeding.  Bleeding from a tonsillectomy and adenoidectomy is minimal and postoperatively the risk of bleeding is approximately four percent, although this rarely life threatening. ° ° ° °After your tonsillectomy and adenoidectomy post-op care at home: ° °1. Our patients are able to go home the same day.  You may be given prescriptions for pain  medications and antibiotics, if indicated. °2. It is extremely important to remember that fluid intake is of utmost importance after a tonsillectomy.  The amount that you drink must be maintained in the postoperative period.  A good indication of whether a child is getting enough fluid is whether his/her urine output is constant.  As long as children are urinating or wetting their diaper every 6 - 8 hours this is usually enough fluid intake.   °3. Although rare, this is a risk of some bleeding in the first ten days after surgery.  This is usually occurs between day five and nine postoperatively.  This risk of bleeding is approximately four percent.  If you or your child should have any bleeding you should remain calm and notify our office or go directly to the Emergency Room at  [facility] where they will contact us. Our doctors are available seven days a week for notification.  We recommend sitting up quietly in a chair, place an ice pack on the front of the neck and spitting out the blood gently until we are able to contact you.  Adults should gargle gently with ice water and this may help stop the bleeding.  If the bleeding does not stop after a short time, i.e. 10 to 15 minutes, or seems to be increasing again, please contact us or go to the hospital.   °4. It is common for the pain to be worse at 5 - 7 days postoperatively.  This occurs because the “scab” is peeling off and the mucous membrane (skin of   the throat) is growing back where the tonsils were.   °5. It is common for a low-grade fever, less than 102, during the first week after a tonsillectomy and adenoidectomy.  It is usually due to not drinking enough liquids, and we suggest your use liquid Tylenol or the pain medicine with Tylenol prescribed in order to keep your temperature below 102.  Please follow the directions on the back of the bottle. °6. Do not take aspirin or any products that contain aspirin such as Bufferin, Anacin,  Ecotrin, aspirin gum, Goodies, BC headache powders, etc., after a T&A because it can promote bleeding.  Please check with our office before administering any other medication that may been prescribed by other doctors during the two week post-operative period. °7. If you happen to look in the mirror or into your child’s mouth you will see white/gray patches on the back of the throat.  This is what a scab looks like in the mouth and is normal after having a T&A.  It will disappear once the tonsil area heals completely. However, it may cause a noticeable odor, and this too will disappear with time.  Warm salt water gargles may be used to keep the throat clean and promote healing.   °8. You or your child may experience ear pain after having a T&A.  This is called referred pain and comes from the throat, but it is felt in the ears.  Ear pain is quite common and expected.  It will usually go away after ten days.  There is usually nothing wrong with the ears, and it is primarily due to the healing area stimulating the nerve to the ear that runs along the side of the throat.  Use either the prescribed pain medicine or Tylenol as needed.  °9. The throat tissues after a tonsillectomy are obviously sensitive.  Smoking around children who have had a tonsillectomy significantly increases the risk of bleeding.  DO NOT SMOKE!  ° °General Anesthesia, Care After °Refer to this sheet in the next few weeks. These instructions provide you with information on caring for yourself after your procedure. Your health care provider may also give you more specific instructions. Your treatment has been planned according to current medical practices, but problems sometimes occur. Call your health care provider if you have any problems or questions after your procedure. °WHAT TO EXPECT AFTER THE PROCEDURE °After the procedure, it is typical to experience: °· Sleepiness. °· Nausea and vomiting. °HOME CARE INSTRUCTIONS °· For the first 24 hours  after general anesthesia: °¨ Have a responsible person with you. °¨ Do not drive a car. If you are alone, do not take public transportation. °¨ Do not drink alcohol. °¨ Do not take medicine that has not been prescribed by your health care provider. °¨ Do not sign important papers or make important decisions. °¨ You may resume a normal diet and activities as directed by your health care provider. °· Change bandages (dressings) as directed. °· If you have questions or problems that seem related to general anesthesia, call the hospital and ask for the anesthetist or anesthesiologist on call. °SEEK MEDICAL CARE IF: °· You have nausea and vomiting that continue the day after anesthesia. °· You develop a rash. °SEEK IMMEDIATE MEDICAL CARE IF:  °· You have difficulty breathing. °· You have chest pain. °· You have any allergic problems. °Document Released: 04/06/2000 Document Revised: 01/03/2013 Document Reviewed: 07/14/2012 °ExitCare® Patient Information ©2015 [employer]. This information is not intended to   replace advice given to you by your health care provider. Make sure you discuss any questions you have with your health care provider. ° °

## 2014-08-17 ENCOUNTER — Encounter
Admission: RE | Disposition: A | Discharge status: Home / Self Care | Payer: Self-pay | Source: Ambulatory Visit | Attending: Unknown Physician Specialty

## 2014-08-17 ENCOUNTER — Encounter: Payer: Self-pay | Admitting: Anesthesiology

## 2014-08-17 ENCOUNTER — Ambulatory Visit: Payer: BLUE CROSS/BLUE SHIELD | Admitting: Anesthesiology

## 2014-08-17 ENCOUNTER — Ambulatory Visit
Admission: RE | Admit: 2014-08-17 | Discharge: 2014-08-17 | Disposition: A | Discharge status: Home / Self Care | Payer: BLUE CROSS/BLUE SHIELD | Source: Ambulatory Visit | Attending: Unknown Physician Specialty | Admitting: Unknown Physician Specialty

## 2014-08-17 DIAGNOSIS — Z886 Allergy status to analgesic agent status: Secondary | ICD-10-CM | POA: Insufficient documentation

## 2014-08-17 DIAGNOSIS — J45909 Unspecified asthma, uncomplicated: Secondary | ICD-10-CM | POA: Insufficient documentation

## 2014-08-17 DIAGNOSIS — Z888 Allergy status to other drugs, medicaments and biological substances status: Secondary | ICD-10-CM | POA: Insufficient documentation

## 2014-08-17 DIAGNOSIS — Z8261 Family history of arthritis: Secondary | ICD-10-CM | POA: Diagnosis not present

## 2014-08-17 DIAGNOSIS — J3503 Chronic tonsillitis and adenoiditis: Secondary | ICD-10-CM | POA: Insufficient documentation

## 2014-08-17 DIAGNOSIS — R51 Headache: Secondary | ICD-10-CM | POA: Insufficient documentation

## 2014-08-17 DIAGNOSIS — Z809 Family history of malignant neoplasm, unspecified: Secondary | ICD-10-CM | POA: Diagnosis not present

## 2014-08-17 DIAGNOSIS — Z79899 Other long term (current) drug therapy: Secondary | ICD-10-CM | POA: Diagnosis not present

## 2014-08-17 DIAGNOSIS — Z833 Family history of diabetes mellitus: Secondary | ICD-10-CM | POA: Insufficient documentation

## 2014-08-17 DIAGNOSIS — M1990 Unspecified osteoarthritis, unspecified site: Secondary | ICD-10-CM | POA: Diagnosis not present

## 2014-08-17 DIAGNOSIS — Z8489 Family history of other specified conditions: Secondary | ICD-10-CM | POA: Diagnosis not present

## 2014-08-17 DIAGNOSIS — H47333 Pseudopapilledema of optic disc, bilateral: Secondary | ICD-10-CM | POA: Diagnosis not present

## 2014-08-17 DIAGNOSIS — Z6841 Body Mass Index (BMI) 40.0 and over, adult: Secondary | ICD-10-CM | POA: Diagnosis not present

## 2014-08-17 DIAGNOSIS — F17210 Nicotine dependence, cigarettes, uncomplicated: Secondary | ICD-10-CM | POA: Diagnosis not present

## 2014-08-17 DIAGNOSIS — Z7951 Long term (current) use of inhaled steroids: Secondary | ICD-10-CM | POA: Diagnosis not present

## 2014-08-17 HISTORY — PX: TONSILLECTOMY AND ADENOIDECTOMY: SHX28

## 2014-08-17 SURGERY — TONSILLECTOMY AND ADENOIDECTOMY
Anesthesia: General | Laterality: Bilateral | Wound class: Clean Contaminated

## 2014-08-17 MED ORDER — HYDROMORPHONE HCL 1 MG/ML IJ SOLN
0.5000 mg | INTRAMUSCULAR | Status: DC | PRN
  Administered 2014-08-17: 0.5 mg via INTRAVENOUS

## 2014-08-17 MED ORDER — ACETAMINOPHEN 10 MG/ML IV SOLN
1000.0000 mg | Freq: Once | INTRAVENOUS | Status: AC
  Administered 2014-08-17: 1000 mg via INTRAVENOUS

## 2014-08-17 MED ORDER — SUCCINYLCHOLINE CHLORIDE 20 MG/ML IJ SOLN
INTRAMUSCULAR | Status: DC | PRN
  Administered 2014-08-17: 100 mg via INTRAVENOUS

## 2014-08-17 MED ORDER — PROMETHAZINE HCL 25 MG/ML IJ SOLN
12.5000 mg | Freq: Once | INTRAMUSCULAR | Status: AC | PRN
  Administered 2014-08-17: 12.5 mg via INTRAVENOUS

## 2014-08-17 MED ORDER — HYDROCODONE-ACETAMINOPHEN 7.5-325 MG/15ML PO SOLN: 15.0000 mL | ORAL | Status: DC | PRN

## 2014-08-17 MED ORDER — DEXAMETHASONE SODIUM PHOSPHATE 4 MG/ML IJ SOLN
INTRAMUSCULAR | Status: DC | PRN
  Administered 2014-08-17: 10 mg via INTRAVENOUS

## 2014-08-17 MED ORDER — LIDOCAINE HCL (CARDIAC) 20 MG/ML IV SOLN
INTRAVENOUS | Status: DC | PRN
  Administered 2014-08-17: 50 mg via INTRAVENOUS

## 2014-08-17 MED ORDER — LACTATED RINGERS IV SOLN
INTRAVENOUS | Status: DC
  Administered 2014-08-17: 10:00:00 via INTRAVENOUS

## 2014-08-17 MED ORDER — PROPOFOL 10 MG/ML IV BOLUS
INTRAVENOUS | Status: DC | PRN
  Administered 2014-08-17: 200 mg via INTRAVENOUS
  Administered 2014-08-17: 70 mg via INTRAVENOUS

## 2014-08-17 MED ORDER — FENTANYL CITRATE (PF) 100 MCG/2ML IJ SOLN
INTRAMUSCULAR | Status: DC | PRN
  Administered 2014-08-17: 100 ug via INTRAVENOUS

## 2014-08-17 MED ORDER — OXYCODONE HCL 5 MG/5ML PO SOLN
5.0000 mg | Freq: Once | ORAL | Status: AC | PRN
  Administered 2014-08-17: 5 mg via ORAL

## 2014-08-17 MED ORDER — OXYCODONE HCL 5 MG PO TABS: 5.0000 mg | ORAL_TABLET | Freq: Once | ORAL | Status: AC | PRN

## 2014-08-17 MED ORDER — BUPIVACAINE HCL (PF) 0.5 % IJ SOLN
INTRAMUSCULAR | Status: DC | PRN
  Administered 2014-08-17: 8 mL

## 2014-08-17 MED ORDER — MIDAZOLAM HCL 5 MG/5ML IJ SOLN
INTRAMUSCULAR | Status: DC | PRN
  Administered 2014-08-17: 2 mg via INTRAVENOUS

## 2014-08-17 MED ORDER — GLYCOPYRROLATE 0.2 MG/ML IJ SOLN
INTRAMUSCULAR | Status: DC | PRN
  Administered 2014-08-17: 0.1 mg via INTRAVENOUS

## 2014-08-17 MED ORDER — ONDANSETRON HCL 4 MG/2ML IJ SOLN
INTRAMUSCULAR | Status: DC | PRN
  Administered 2014-08-17: 4 mg via INTRAVENOUS

## 2014-08-17 SURGICAL SUPPLY — 20 items
CANISTER SUCT 1200ML W/VALVE (MISCELLANEOUS) ×3 IMPLANT
CATH RUBBER RED 8F (CATHETERS) ×3 IMPLANT
COAG SUCT 10F 3.5MM HAND CTRL (MISCELLANEOUS) ×3 IMPLANT
DRAPE HEAD BAR (DRAPES) ×3 IMPLANT
ELECT CAUTERY BLADE TIP 2.5 (TIP) ×3
ELECTRODE CAUTERY BLDE TIP 2.5 (TIP) ×1 IMPLANT
GLOVE BIO SURGEON STRL SZ7.5 (GLOVE) ×3 IMPLANT
HANDLE SUCTION POOLE (INSTRUMENTS) ×1 IMPLANT
NEEDLE HYPO 25GX1X1/2 BEV (NEEDLE) ×3 IMPLANT
NS IRRIG 500ML POUR BTL (IV SOLUTION) ×3 IMPLANT
PACK TONSIL/ADENOIDS (PACKS) ×3 IMPLANT
PAD GROUND ADULT SPLIT (MISCELLANEOUS) ×3 IMPLANT
PENCIL ELECTRO HAND CTR (MISCELLANEOUS) ×3 IMPLANT
SOL ANTI-FOG 6CC FOG-OUT (MISCELLANEOUS) ×1 IMPLANT
SOL FOG-OUT ANTI-FOG 6CC (MISCELLANEOUS) ×2
SPONGE TONSIL 7/8 RF SGL LF (GAUZE/BANDAGES/DRESSINGS) ×3 IMPLANT
STRAP BODY AND KNEE 60X3 (MISCELLANEOUS) ×3 IMPLANT
SUCTION POOLE HANDLE (INSTRUMENTS) ×3
SYR 5ML LL (SYRINGE) ×3 IMPLANT
SYRINGE 10CC LL (SYRINGE) IMPLANT

## 2014-08-17 NOTE — Anesthesia Preprocedure Evaluation (Signed)
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;  Pseudopapilledema of both optic discs;    negative psych ROS   GI/Hepatic Neg liver ROS, GERD-  Controlled,  Endo/Other  Morbid obesity  Renal/GU negative Renal ROS  negative genitourinary   Musculoskeletal  (+) Arthritis -,   Abdominal   Peds  Hematology negative hematology ROS (+)   Anesthesia Other Findings   Reproductive/Obstetrics negative OB ROS                             Anesthesia Physical Anesthesia Plan  ASA: II  Anesthesia Plan: General ETT   Post-op Pain Management:    Induction:   Airway Management Planned:   Additional Equipment:   Intra-op Plan:   Post-operative Plan:   Informed Consent: I have reviewed the patients History and Physical, chart, labs and discussed the procedure including the risks, benefits and alternatives for the proposed anesthesia with the patient or authorized representative who has indicated his/her understanding and acceptance.     Plan Discussed with: CRNA  Anesthesia Plan Comments:         Anesthesia Quick Evaluation

## 2014-08-17 NOTE — Transfer of Care (Signed)
Immediate Anesthesia Transfer of Care Note  Patient: Lisa Sharp  Procedure(s) Performed: Procedure(s): TONSILLECTOMY AND ADENOIDECTOMY (Bilateral)  Patient Location: PACU  Anesthesia Type: General ETT  Level of Consciousness: awake, alert  and patient cooperative  Airway and Oxygen Therapy: Patient Spontanous Breathing and Patient connected to supplemental oxygen  Post-op Assessment: Post-op Vital signs reviewed, Patient's Cardiovascular Status Stable, Respiratory Function Stable, Patent Airway and No signs of Nausea or vomiting  Post-op Vital Signs: Reviewed and stable  Complications: No apparent anesthesia complications

## 2014-08-17 NOTE — Anesthesia Postprocedure Evaluation (Signed)
  Anesthesia Post-op Note  Patient: Lisa Sharp  Procedure(s) Performed: Procedure(s): TONSILLECTOMY AND ADENOIDECTOMY (Bilateral)  Anesthesia type:General ETT  Patient location: PACU  Post pain: Pain level controlled  Post assessment: Post-op Vital signs reviewed, Patient's Cardiovascular Status Stable, Respiratory Function Stable, Patent Airway and No signs of Nausea or vomiting  Post vital signs: Reviewed and stable  Last Vitals:  Filed Vitals:   08/17/14 1214  BP:   Pulse: 83  Temp:   Resp: 16    Level of consciousness: awake, alert  and patient cooperative  Complications: No apparent anesthesia complications

## 2014-08-17 NOTE — H&P (Signed)
  H+P  Reviewed and will be scanned in later. No changes noted. 

## 2014-08-17 NOTE — Transfer of Care (Signed)
Immediate Anesthesia Transfer of Care Note  Patient: Lisa Sharp  Procedure(s) Performed: Procedure(s): TONSILLECTOMY AND ADENOIDECTOMY (Bilateral)  Patient Location: PACU  Anesthesia Type: General ETT  Level of Consciousness: awake, alert  and patient cooperative  Airway and Oxygen Therapy: Patient Spontanous Breathing and Patient connected to supplemental oxygen  Post-op Assessment: Post-op Vital signs reviewed, Patient's Cardiovascular Status Stable, Respiratory Function Stable, Patent Airway and No signs of Nausea or vomiting  Post-op Vital Signs: Reviewed and stable  Complications: No apparent anesthesia complications  

## 2014-08-17 NOTE — Op Note (Signed)
PREOPERATIVE DIAGNOSIS:  CHRONIC T AND J35.01  POSTOPERATIVE DIAGNOSIS: Same  OPERATION:  Tonsillectomy and adenoidectomy.  SURGEON:  Davina Pokeeen, MD  ANESTHESIA:  General endotracheal.  OPERATIVE FINDINGS:  Large tonsils and adenoids.  DESCRIPTION OF THE PROCEDURE:  Lisa Sharp was identified in the holding area and taken to the operating room and placed in the supine position.  After general endotracheal anesthesia, the table was turned 45 degrees and the patient was draped in the usual fashion for a tonsillectomy.  A mouth gag was inserted into the oral cavity and examination of the oropharynx showed the uvula was non-bifid.  There was no evidence of submucous cleft to the palate.  There were large tonsils.  A red rubber catheter was placed through the nostril.  Examination of the nasopharynx showed large obstructing adenoids.  Under indirect vision with the mirror, an adenotome was placed in the nasopharynx.  The adenoids were curetted free.  Reinspection with a mirror showed excellent removal of the adenoid.  Nasopharyngeal packs were then placed.  The operation then turned to the tonsillectomy.  Beginning on the left-hand side a tenaculum was used to grasp the tonsil and the Bovie cautery was used to dissect it free from the fossa.  In a similar fashion, the right tonsil was removed.  Meticulous hemostasis was achieved using the Bovie cautery.  With both tonsils removed and no active bleeding, the nasopharyngeal packs were removed.  Suction cautery was then used to cauterize the nasopharyngeal bed to prevent bleeding.  The red rubber catheter was removed with no active bleeding.  0.5% plain Marcaine was used to inject the anterior and posterior tonsillar pillars bilaterally.  A total of 8 was used.  The patient tolerated the procedure well and was awakened in the operating room and taken to the recovery room in stable condition.   CULTURES:  None.  SPECIMENS:  Tonsils and  adenoids.  ESTIMATED BLOOD LOSS:  Less than 20 ml.  [name]  08/17/2014  11:19 AM

## 2014-08-17 NOTE — Anesthesia Procedure Notes (Signed)
Procedure Name: Intubation Date/Time: 08/17/2014 11:00 AM Performed by: Jimmy Picket Pre-anesthesia Checklist: Patient identified, Emergency Drugs available, Suction available, Patient being monitored and Timeout performed Patient Re-evaluated:Patient Re-evaluated prior to inductionOxygen Delivery Method: Circle system utilized Preoxygenation: Pre-oxygenation with 100% oxygen Intubation Type: IV induction Ventilation: Mask ventilation without difficulty Laryngoscope Size: Miller and 2 Grade View: Grade I Tube type: Oral Rae Tube size: 7.0 mm Number of attempts: 1 Placement Confirmation: ETT inserted through vocal cords under direct vision,  positive ETCO2 and breath sounds checked- equal and bilateral Tube secured with: Tape Dental Injury: Teeth and Oropharynx as per pre-operative assessment

## 2014-08-20 ENCOUNTER — Encounter: Payer: Self-pay | Admitting: Unknown Physician Specialty

## 2014-08-21 LAB — SURGICAL PATHOLOGY

## 2014-12-16 ENCOUNTER — Emergency Department: Payer: Self-pay

## 2014-12-16 ENCOUNTER — Encounter: Payer: Self-pay | Admitting: Emergency Medicine

## 2014-12-16 ENCOUNTER — Emergency Department
Admission: EM | Admit: 2014-12-16 | Discharge: 2014-12-16 | Disposition: A | Discharge status: Home / Self Care | Payer: Self-pay | Attending: Emergency Medicine | Admitting: Emergency Medicine

## 2014-12-16 DIAGNOSIS — Z3202 Encounter for pregnancy test, result negative: Secondary | ICD-10-CM | POA: Insufficient documentation

## 2014-12-16 DIAGNOSIS — Z79899 Other long term (current) drug therapy: Secondary | ICD-10-CM | POA: Insufficient documentation

## 2014-12-16 DIAGNOSIS — B9689 Other specified bacterial agents as the cause of diseases classified elsewhere: Secondary | ICD-10-CM

## 2014-12-16 DIAGNOSIS — N83201 Unspecified ovarian cyst, right side: Secondary | ICD-10-CM | POA: Insufficient documentation

## 2014-12-16 DIAGNOSIS — F17200 Nicotine dependence, unspecified, uncomplicated: Secondary | ICD-10-CM | POA: Insufficient documentation

## 2014-12-16 DIAGNOSIS — Z792 Long term (current) use of antibiotics: Secondary | ICD-10-CM | POA: Insufficient documentation

## 2014-12-16 DIAGNOSIS — N760 Acute vaginitis: Secondary | ICD-10-CM | POA: Insufficient documentation

## 2014-12-16 DIAGNOSIS — N83209 Unspecified ovarian cyst, unspecified side: Secondary | ICD-10-CM

## 2014-12-16 DIAGNOSIS — R102 Pelvic and perineal pain: Secondary | ICD-10-CM

## 2014-12-16 LAB — URINALYSIS COMPLETE WITH MICROSCOPIC (ARMC ONLY)
BACTERIA UA: NONE SEEN
Bilirubin Urine: NEGATIVE
Glucose, UA: NEGATIVE mg/dL
HGB URINE DIPSTICK: NEGATIVE
Ketones, ur: NEGATIVE mg/dL
Leukocytes, UA: NEGATIVE
Nitrite: NEGATIVE
PH: 5 (ref 5.0–8.0)
Protein, ur: NEGATIVE mg/dL
Specific Gravity, Urine: 1.01 (ref 1.005–1.030)

## 2014-12-16 LAB — CBC
HCT: 41.6 % (ref 35.0–47.0)
Hemoglobin: 14.1 g/dL (ref 12.0–16.0)
MCH: 29.9 pg (ref 26.0–34.0)
MCHC: 33.8 g/dL (ref 32.0–36.0)
MCV: 88.6 fL (ref 80.0–100.0)
Platelets: 278 10*3/uL (ref 150–440)
RBC: 4.7 MIL/uL (ref 3.80–5.20)
RDW: 13.6 % (ref 11.5–14.5)
WBC: 8.9 10*3/uL (ref 3.6–11.0)

## 2014-12-16 LAB — WET PREP, GENITAL
Sperm: NONE SEEN
Trich, Wet Prep: NONE SEEN
YEAST WET PREP: NONE SEEN

## 2014-12-16 LAB — COMPREHENSIVE METABOLIC PANEL
ALT: 25 U/L (ref 14–54)
AST: 17 U/L (ref 15–41)
Albumin: 4.1 g/dL (ref 3.5–5.0)
Alkaline Phosphatase: 83 U/L (ref 38–126)
Anion gap: 5 (ref 5–15)
BUN: 11 mg/dL (ref 6–20)
CO2: 24 mmol/L (ref 22–32)
Calcium: 8.9 mg/dL (ref 8.9–10.3)
Chloride: 105 mmol/L (ref 101–111)
Creatinine, Ser: 0.72 mg/dL (ref 0.44–1.00)
GFR calc Af Amer: >60 mL/min (ref >60)
GFR calc non Af Amer: >60 mL/min (ref >60)
Glucose, Bld: 107 mg/dL — ABNORMAL HIGH (ref 65–99)
Potassium: 3.6 mmol/L (ref 3.5–5.1)
Sodium: 134 mmol/L — ABNORMAL LOW (ref 135–145)
Total Bilirubin: 0.4 mg/dL (ref 0.3–1.2)
Total Protein: 7.3 g/dL (ref 6.5–8.1)

## 2014-12-16 LAB — CHLAMYDIA/NGC RT PCR (ARMC ONLY)
Chlamydia Tr: NOT DETECTED
N GONORRHOEAE: NOT DETECTED

## 2014-12-16 LAB — LIPASE, BLOOD: Lipase: 30 U/L (ref 11–51)

## 2014-12-16 LAB — POCT PREGNANCY, URINE: PREG TEST UR: NEGATIVE

## 2014-12-16 MED ORDER — OXYCODONE-ACETAMINOPHEN 5-325 MG PO TABS
2.0000 | ORAL_TABLET | Freq: Once | ORAL | Status: AC
  Administered 2014-12-16: 2 via ORAL
  Filled 2014-12-16: qty 2

## 2014-12-16 MED ORDER — ONDANSETRON 4 MG PO TBDP
4.0000 mg | ORAL_TABLET | Freq: Once | ORAL | Status: AC
  Administered 2014-12-16: 4 mg via ORAL

## 2014-12-16 MED ORDER — METRONIDAZOLE 500 MG PO TABS
500.0000 mg | ORAL_TABLET | Freq: Once | ORAL | Status: AC
  Administered 2014-12-16: 500 mg via ORAL

## 2014-12-16 MED ORDER — METRONIDAZOLE 500 MG PO TABS
ORAL_TABLET | ORAL | Status: AC
  Administered 2014-12-16: 500 mg via ORAL
  Filled 2014-12-16: qty 1

## 2014-12-16 MED ORDER — OXYCODONE-ACETAMINOPHEN 5-325 MG PO TABS: 1.0000 | ORAL_TABLET | Freq: Four times a day (QID) | ORAL | Status: DC | PRN

## 2014-12-16 MED ORDER — SODIUM CHLORIDE 0.9 % IV BOLUS (SEPSIS)
1000.0000 mL | Freq: Once | INTRAVENOUS | Status: AC
  Administered 2014-12-16: 1000 mL via INTRAVENOUS

## 2014-12-16 MED ORDER — METRONIDAZOLE 500 MG PO TABS: 500.0000 mg | ORAL_TABLET | Freq: Two times a day (BID) | ORAL | Status: AC

## 2014-12-16 MED ORDER — ONDANSETRON 4 MG PO TBDP
ORAL_TABLET | ORAL | Status: AC
  Administered 2014-12-16: 4 mg via ORAL
  Filled 2014-12-16: qty 1

## 2014-12-16 MED ORDER — IOHEXOL 240 MG/ML SOLN
25.0000 mL | Freq: Once | INTRAMUSCULAR | Status: DC | PRN
  Filled 2014-12-16: qty 25

## 2014-12-16 MED ORDER — ONDANSETRON HCL 4 MG/2ML IJ SOLN
4.0000 mg | Freq: Once | INTRAMUSCULAR | Status: AC
Start: 2014-12-16 — End: 2014-12-16
  Administered 2014-12-16: 4 mg via INTRAVENOUS
  Filled 2014-12-16: qty 2

## 2014-12-16 MED ORDER — IOHEXOL 300 MG/ML  SOLN
125.0000 mL | Freq: Once | INTRAMUSCULAR | Status: AC | PRN
  Administered 2014-12-16: 125 mL via INTRAVENOUS
  Filled 2014-12-16: qty 125

## 2014-12-16 MED ORDER — IOHEXOL 240 MG/ML SOLN
25.0000 mL | Freq: Once | INTRAMUSCULAR | Status: AC | PRN
  Administered 2014-12-16: 25 mL via ORAL
  Filled 2014-12-16: qty 25

## 2014-12-16 MED ORDER — MORPHINE SULFATE (PF) 4 MG/ML IV SOLN
4.0000 mg | Freq: Once | INTRAVENOUS | Status: AC
  Administered 2014-12-16: 4 mg via INTRAVENOUS
  Filled 2014-12-16: qty 1

## 2014-12-16 NOTE — ED Notes (Signed)
Pt presents to ER for c/o of mid abdominal pain that hurts worse on the right side that started Friday night. Nauseated only, constant pain that hurts when she coughs or stands within a few minutes. Denies fevers. Maintains diet. Diarrhea started this am and denies blood in stool. 4/10 pain

## 2014-12-16 NOTE — ED Notes (Signed)
Patient with no complaints at this time. Respirations even and unlabored. Skin warm/dry. Discharge instructions reviewed with patient at this time. Patient given opportunity to voice concerns/ask questions. IV removed per policy and band-aid applied to site. Patient discharged at this time and left Emergency Department with steady gait.  

## 2014-12-16 NOTE — ED Provider Notes (Signed)
[facility] Emergency Department Provider Note  ____________________________________________  Time seen: Approximately 340PM  I have reviewed the triage vital signs and the nursing notes.   HISTORY  Chief Complaint Abdominal Pain    HPI Lisa Sharp is a 34 y.o. female with a history of a tubal ligation is presenting today with 3 days of lower abdominal pain. She says that she has had nausea but no vomiting. Says the pain started around her umbilicus and then radiated to the right lower quadrant. Denies any vaginal discharge or bleeding. No history of sexually transmitted diseases. Still has her appendix. Says is worse with walking as well as when she coughs. Patient would also diarrhea since this morning. Denies any bleeding in her stool.   Past Medical History  Diagnosis Date  . Complication of anesthesia     severe nausea and vomiting  . PONV (postoperative nausea and vomiting)   . Family history of adverse reaction to anesthesia     severe nausea and vomiting  . Asthma   . Chronic bronchitis (HCC) 1992  . Headache     migraines 1-2 per month  . GERD (gastroesophageal reflux disease)   . RLS (restless legs syndrome)   . Pseudopapilledema of both optic discs 1995  . Arthritis     left knee and hips  . Orthostatic hypotension   . Insomnia   . Guillain-Barre disease (HCC) 02/17/2003    B leg weakness    There are no active problems to display for this patient.   Past Surgical History  Procedure Laterality Date  . Knee arthroscopy Left 2005  . Cholecystectomy  2009  . Tubal ligation Bilateral 2010  . Ganglion cyst excision Left 06/12/2014    Procedure: REMOVAL GANGLION OF WRIST;  Surgeon: Kennedy Bucker, MD;  Location: [facility] OR[facility];  Service: Orthopedics;  Laterality: Left;  . Tonsillectomy and adenoidectomy Bilateral 08/17/2014    Procedure: TONSILLECTOMY AND ADENOIDECTOMY;  Surgeon: Linus Salmons, MD;  Location: [facility];  Service: ENT;   Laterality: Bilateral;    Current Outpatient Rx  Name  Route  Sig  Dispense  Refill  . acetaminophen (TYLENOL) 500 MG tablet   Oral   Take 500 mg by mouth every 6 (six) hours as needed for headache.         . Albuterol Sulfate 108 (90 BASE) MCG/ACT AEPB   Inhalation   Inhale 2 puffs into the lungs as needed. 2-4 puffs as needed         . amoxicillin-clavulanate (AUGMENTIN) 875-125 MG per tablet   Oral   Take 1 tablet by mouth 2 (two) times daily.         . benzonatate (TESSALON) 100 MG capsule   Oral   Take by mouth 3 (three) times daily as needed for cough.         . fluticasone (FLOVENT HFA) 220 MCG/ACT inhaler   Inhalation   Inhale into the lungs 2 (two) times daily.         . Fluticasone-Salmeterol (ADVAIR) 100-50 MCG/DOSE AEPB   Inhalation   Inhale 1 puff into the lungs 2 (two) times daily. Pt uses this med once a day, makes my throat sore         . gabapentin (NEURONTIN) 600 MG tablet   Oral   Take 600 mg by mouth 2 (two) times daily.         Marland Kitchen HYDROcodone-acetaminophen (HYCET) 7.5-325 mg/15 ml solution   Oral   Take  15 mLs by mouth every 4 (four) hours as needed for moderate pain.   650 mL   0   . HYDROcodone-acetaminophen (NORCO) 5-325 MG per tablet   Oral   Take 1-2 tablets by mouth every 6 (six) hours as needed for moderate pain.   30 tablet   0   . ibuprofen (ADVIL,MOTRIN) 800 MG tablet   Oral   Take 800 mg by mouth 2 (two) times daily.         . metroNIDAZOLE (FLAGYL) 500 MG tablet   Oral   Take 1 tablet (500 mg total) by mouth 2 (two) times daily.   14 tablet   0   . montelukast (SINGULAIR) 10 MG tablet   Oral   Take 10 mg by mouth at bedtime.         Marland Kitchen. omeprazole (PRILOSEC) 20 MG capsule   Oral   Take 20 mg by mouth every morning.         . ondansetron (ZOFRAN ODT) 4 MG disintegrating tablet   Oral   Take 1 tablet (4 mg total) by mouth every 8 (eight) hours as needed for nausea or vomiting.   20 tablet   0   .  oxybutynin (DITROPAN) 5 MG tablet   Oral   Take 5 mg by mouth every morning.         Marland Kitchen. oxyCODONE-acetaminophen (ROXICET) 5-325 MG tablet   Oral   Take 1-2 tablets by mouth every 6 (six) hours as needed.   15 tablet   0   . predniSONE (DELTASONE) 10 MG tablet   Oral   Take 10 mg by mouth as needed (take 6 tabs for asthma emergency).           Allergies Aspirin; Petroleum jelly; and Verapamil hcl er  History reviewed. No pertinent family history.  Social History Social History  Substance Use Topics  . Smoking status: Current Every Day Smoker -- 1.00 packs/day for 5 years  . Smokeless tobacco: None  . Alcohol Use: 0.6 oz/week    1 Glasses of wine per week    Review of Systems Constitutional: No fever/chills Eyes: No visual changes. ENT: No sore throat. Cardiovascular: Denies chest pain. Respiratory: Denies shortness of breath. Gastrointestinal: no vomiting.    No constipation. Genitourinary: Negative for dysuria. Musculoskeletal: Negative for back pain. Skin: Negative for rash. Neurological: Negative for headaches, focal weakness or numbness.  10-point ROS otherwise negative.  ____________________________________________   PHYSICAL EXAM:  VITAL SIGNS: ED Triage Vitals  Enc Vitals Group     BP --      Pulse Rate 12/16/14 1333 84     Resp 12/16/14 1333 18     Temp 12/16/14 1333 98.2 F (36.8 C)     Temp Source 12/16/14 1333 Oral     SpO2 12/16/14 1333 98 %     Weight 12/16/14 1333 265 lb (120.203 kg)     Height 12/16/14 1333 5' 6.5" (1.689 m)     Head Cir --      Peak Flow --      Pain Score 12/16/14 1508 4     Pain Loc --      Pain Edu? --      Excl. in GC? --     Constitutional: Alert and oriented. Well appearing and in no acute distress. Eyes: Conjunctivae are normal. PERRL. EOMI. Head: Atraumatic. Nose: No congestion/rhinnorhea. Mouth/Throat: Mucous membranes are moist.  Oropharynx non-erythematous. Neck: No stridor.   Cardiovascular:  Normal rate, regular rhythm. Grossly normal heart sounds.  Good peripheral circulation. Respiratory: Normal respiratory effort.  No retractions. Lungs CTAB. Gastrointestinal: Soft with tenderness over the suprapubic region as well as the right lower quadrant. The patient is specifically tender over the suprapubic region which is worse than the right lower quadrant.. No distention. No abdominal bruits. No CVA tenderness. Genitourinary:  Normal external exam. Speculum exam with small amount of white discharge. Bimanual exam with uterine as well as right adnexal tenderness palpation without any masses palpated. Musculoskeletal: No lower extremity tenderness nor edema.  No joint effusions. Neurologic:  Normal speech and language. No gross focal neurologic deficits are appreciated. No gait instability. Skin:  Skin is warm, dry and intact. No rash noted. Psychiatric: Mood and affect are normal. Speech and behavior are normal.  ____________________________________________   LABS (all labs ordered are listed, but only abnormal results are displayed)  Labs Reviewed  WET PREP, GENITAL - Abnormal; Notable for the following:    Clue Cells Wet Prep HPF POC FEW (*)    WBC, Wet Prep HPF POC MODERATE (*)    All other components within normal limits  COMPREHENSIVE METABOLIC PANEL - Abnormal; Notable for the following:    Sodium 134 (*)    Glucose, Bld 107 (*)    All other components within normal limits  URINALYSIS COMPLETEWITH MICROSCOPIC ([facility] ONLY) - Abnormal; Notable for the following:    Color, Urine YELLOW (*)    APPearance HAZY (*)    Squamous Epithelial / LPF 0-5 (*)    All other components within normal limits  CHLAMYDIA/NGC RT PCR ([facility] ONLY)  LIPASE, BLOOD  CBC  POC URINE PREG, ED  POCT PREGNANCY, URINE   ____________________________________________  EKG   ____________________________________________  RADIOLOGY  CAT scan as well as the outside of the pelvis both reveal  a complex right-sided hemorrhagic ovarian cyst without torsion. ____________________________________________   PROCEDURES    ____________________________________________   INITIAL IMPRESSION / ASSESSMENT AND PLAN / ED COURSE  Pertinent labs & imaging results that were available during my care of the patient were reviewed by me and considered in my medical decision making (see chart for details).  ----------------------------------------- 7:07 PM on 12/16/2014 -----------------------------------------  Discussed the lab as well as imaging results with patient. She understands that she has this complex right-sided hemorrhagic ovarian cyst. She also understands she must follow-up with an OB/GYN. She is having an increased amount of pain after the ultrasound. We will give her Percocet and reassess her pain level. She is aware that she must follow-up with an OB/GYN.  ----------------------------------------- 7:41 PM on 12/16/2014 -----------------------------------------  Patient says still is in pain but says she would rather be in pain at home. I offered to observe her longer in the emergency department and to give her IV pain medications of the Percocets do not start working however she declined. I told her that she may return at any point for further pain control. She knows that she needs follow-up with OB/GYN. We'll discharge home. ____________________________________________   FINAL CLINICAL IMPRESSION(S) / ED DIAGNOSES  Final diagnoses:  Pelvic pain in female  Hemorrhagic cyst of ovary  Bacterial vaginosis   Complex right-sided ovarian cyst. Bacterial vaginosis  Myrna Blazer, MD 12/16/14 1942

## 2015-09-23 ENCOUNTER — Emergency Department: Payer: Self-pay

## 2015-09-23 ENCOUNTER — Encounter: Payer: Self-pay | Admitting: Emergency Medicine

## 2015-09-23 ENCOUNTER — Emergency Department
Admission: EM | Admit: 2015-09-23 | Discharge: 2015-09-23 | Disposition: A | Discharge status: Home / Self Care | Payer: Self-pay | Attending: Emergency Medicine | Admitting: Emergency Medicine

## 2015-09-23 DIAGNOSIS — Z791 Long term (current) use of non-steroidal anti-inflammatories (NSAID): Secondary | ICD-10-CM | POA: Insufficient documentation

## 2015-09-23 DIAGNOSIS — Z87891 Personal history of nicotine dependence: Secondary | ICD-10-CM | POA: Insufficient documentation

## 2015-09-23 DIAGNOSIS — Z79899 Other long term (current) drug therapy: Secondary | ICD-10-CM | POA: Insufficient documentation

## 2015-09-23 DIAGNOSIS — J45901 Unspecified asthma with (acute) exacerbation: Secondary | ICD-10-CM | POA: Insufficient documentation

## 2015-09-23 MED ORDER — ALBUTEROL SULFATE HFA 108 (90 BASE) MCG/ACT IN AERS: 2.0000 | INHALATION_SPRAY | Freq: Four times a day (QID) | RESPIRATORY_TRACT | 0 refills | Status: AC | PRN

## 2015-09-23 MED ORDER — IPRATROPIUM-ALBUTEROL 0.5-2.5 (3) MG/3ML IN SOLN
3.0000 mL | Freq: Once | RESPIRATORY_TRACT | Status: AC
  Administered 2015-09-23: 3 mL via RESPIRATORY_TRACT
  Filled 2015-09-23: qty 3

## 2015-09-23 MED ORDER — PREDNISONE 20 MG PO TABS
60.0000 mg | ORAL_TABLET | Freq: Once | ORAL | Status: AC
  Administered 2015-09-23: 60 mg via ORAL
  Filled 2015-09-23: qty 3

## 2015-09-23 MED ORDER — ALBUTEROL SULFATE (2.5 MG/3ML) 0.083% IN NEBU
INHALATION_SOLUTION | RESPIRATORY_TRACT | Status: AC
  Filled 2015-09-23: qty 3

## 2015-09-23 MED ORDER — ALBUTEROL SULFATE (2.5 MG/3ML) 0.083% IN NEBU
5.0000 mg | INHALATION_SOLUTION | Freq: Once | RESPIRATORY_TRACT | Status: AC
  Administered 2015-09-23: 5 mg via RESPIRATORY_TRACT

## 2015-09-23 MED ORDER — PREDNISONE 10 MG PO TABS: ORAL_TABLET | ORAL | 0 refills | Status: DC

## 2015-09-23 MED ORDER — ALBUTEROL SULFATE (2.5 MG/3ML) 0.083% IN NEBU: 2.5000 mg | INHALATION_SOLUTION | RESPIRATORY_TRACT | 0 refills | Status: AC | PRN

## 2015-09-23 NOTE — Discharge Instructions (Signed)
You're being treated for asthma exacerbation. Return to the emergency room for any worsening trouble breathing, confusion altered mental status, chest pain, fever, or any other symptoms concerning to you.

## 2015-09-23 NOTE — ED Provider Notes (Signed)
[facility] Emergency Department Provider Note ____________________________________________   I have reviewed the triage vital signs and the triage nursing note.  HISTORY  Chief Complaint Shortness of Breath   Historian Patient  HPI Lisa Sharp is a 35 y.o. female with history of asthma, but ran out of albuterol because they lost insurance around January, presents after viral upper respiratory illness last week and now it is "settled in the chest." She states that she's been wheezing really bad the last 3 days. She has no plans or but no albuterol. She has no inhaler right now. She states that typically after she gets some prednisone she starts to feel better.  Chest tightness without chest pain. Sweats without fevers. Shortness of breath is moderate.    Past Medical History:  Diagnosis Date  . Arthritis    left knee and hips  . Asthma   . Chronic bronchitis (HCC) 1992  . Complication of anesthesia    severe nausea and vomiting  . Family history of adverse reaction to anesthesia    severe nausea and vomiting  . GERD (gastroesophageal reflux disease)   . Guillain-Barre disease (HCC) 02/17/2003   B leg weakness  . Headache    migraines 1-2 per month  . Insomnia   . Orthostatic hypotension   . PONV (postoperative nausea and vomiting)   . Pseudopapilledema of both optic discs 1995  . RLS (restless legs syndrome)     There are no active problems to display for this patient.   Past Surgical History:  Procedure Laterality Date  . CHOLECYSTECTOMY  2009  . GANGLION CYST EXCISION Left 06/12/2014   Procedure: REMOVAL GANGLION OF WRIST;  Surgeon: Kennedy Bucker, MD;  Location: [facility] ORS;  Service: Orthopedics;  Laterality: Left;  . KNEE ARTHROSCOPY Left 2005  . TONSILLECTOMY AND ADENOIDECTOMY Bilateral 08/17/2014   Procedure: TONSILLECTOMY AND ADENOIDECTOMY;  Surgeon: Linus Salmons, MD;  Location: [facility];  Service: ENT;  Laterality: Bilateral;   . TUBAL LIGATION Bilateral 2010    Prior to Admission medications   Medication Sig Start Date End Date Taking? Authorizing Provider  acetaminophen (TYLENOL) 500 MG tablet Take 500 mg by mouth every 6 (six) hours as needed for headache.    Historical Provider, MD  albuterol (PROVENTIL HFA;VENTOLIN HFA) 108 (90 Base) MCG/ACT inhaler Inhale 2 puffs into the lungs every 6 (six) hours as needed for wheezing or shortness of breath. 09/23/15   Governor Rooks, MD  albuterol (PROVENTIL) (2.5 MG/3ML) 0.083% nebulizer solution Take 3 mLs (2.5 mg total) by nebulization every 4 (four) hours as needed for wheezing or shortness of breath. 09/23/15   Governor Rooks, MD  amoxicillin-clavulanate (AUGMENTIN) 875-125 MG per tablet Take 1 tablet by mouth 2 (two) times daily.    Historical Provider, MD  benzonatate (TESSALON) 100 MG capsule Take by mouth 3 (three) times daily as needed for cough.    Historical Provider, MD  fluticasone (FLOVENT HFA) 220 MCG/ACT inhaler Inhale into the lungs 2 (two) times daily.    Historical Provider, MD  Fluticasone-Salmeterol (ADVAIR) 100-50 MCG/DOSE AEPB Inhale 1 puff into the lungs 2 (two) times daily. Pt uses this med once a day, makes my throat sore    Historical Provider, MD  gabapentin (NEURONTIN) 600 MG tablet Take 600 mg by mouth 2 (two) times daily.    Historical Provider, MD  HYDROcodone-acetaminophen (HYCET) 7.5-325 mg/15 ml solution Take 15 mLs by mouth every 4 (four) hours as needed for moderate pain. 08/17/14  Linus Salmons, MD  HYDROcodone-acetaminophen [facility]) 5-325 MG per tablet Take 1-2 tablets by mouth every 6 (six) hours as needed for moderate pain. 06/12/14   Kennedy Bucker, MD  ibuprofen (ADVIL,MOTRIN) 800 MG tablet Take 800 mg by mouth 2 (two) times daily.    Historical Provider, MD  montelukast (SINGULAIR) 10 MG tablet Take 10 mg by mouth at bedtime.    Historical Provider, MD  omeprazole (PRILOSEC) 20 MG capsule Take 20 mg by mouth every morning.    Historical  Provider, MD  ondansetron (ZOFRAN ODT) 4 MG disintegrating tablet Take 1 tablet (4 mg total) by mouth every 8 (eight) hours as needed for nausea or vomiting. 06/12/14   Kennedy Bucker, MD  oxybutynin (DITROPAN) 5 MG tablet Take 5 mg by mouth every morning.    Historical Provider, MD  oxyCODONE-acetaminophen (ROXICET) 5-325 MG tablet Take 1-2 tablets by mouth every 6 (six) hours as needed. 12/16/14   Myrna Blazer, MD  predniSONE (DELTASONE) 10 MG tablet 60 mg by mouth daily for 4 more days 09/23/15   Governor Rooks, MD    Allergies  Allergen Reactions  . Aspirin Anaphylaxis  . Petroleum Jelly [Skin Protectants, Misc.] Other (See Comments)    Causes skin burn  . Verapamil Hcl Er Other (See Comments)    Extreme muscle pain    No family history on file.  Social History Social History  Substance Use Topics  . Smoking status: Former Smoker    Packs/day: 1.00    Years: 5.00  . Smokeless tobacco: Never Used  . Alcohol use 0.6 oz/week    1 Glasses of wine per week    Review of Systems  Constitutional: Negative for fever. Eyes: Negative for visual changes. ENT: Negative for sore throat. Cardiovascular: Negative for chest pain. Respiratory: Positive for shortness of breath. Gastrointestinal: Negative for abdominal pain, vomiting and diarrhea. Genitourinary: Negative for dysuria. Musculoskeletal: Negative for back pain. Skin: Negative for rash. Neurological: Negative for headache. 10 point Review of Systems otherwise negative ____________________________________________   PHYSICAL EXAM:  VITAL SIGNS: ED Triage Vitals [09/23/15 1238]  Enc Vitals Group     BP 119/76     Pulse Rate 90     Resp 18     Temp 98.8 F (37.1 C)     Temp Source Oral     SpO2 95 %     Weight 260 lb (117.9 kg)     Height 5\' 7"  (1.702 m)     Head Circumference      Peak Flow      Pain Score 2     Pain Loc      Pain Edu?      Excl. in GC?      Constitutional: Alert and oriented. Well  appearing.  Mild respiratory distress due to wheezing. HEENT   Head: Normocephalic and atraumatic.      Eyes: Conjunctivae are normal. PERRL. Normal extraocular movements.      Ears:         Nose: No congestion/rhinnorhea.   Mouth/Throat: Mucous membranes are moist.   Neck: No stridor. Cardiovascular/Chest: Normal rate, regular rhythm.  No murmurs, rubs, or gallops. Respiratory: Mild respiratory distress due to tachypnea and wheezing. No retractions. No rhonchi. Gastrointestinal: Soft. No distention, no guarding, no rebound. Nontender.    Genitourinary/rectal:Deferred Musculoskeletal: Nontender with normal range of motion in all extremities. No joint effusions.  No lower extremity tenderness.  No edema. Neurologic:  Normal speech and language. No gross or focal  neurologic deficits are appreciated. Skin:  Skin is warm, dry and intact. No rash noted. Psychiatric: Mood and affect are normal. Speech and behavior are normal. Patient exhibits appropriate insight and judgment.   ____________________________________________  LABS (pertinent positives/negatives)  Labs Reviewed - No data to display  ____________________________________________    EKG I, Governor Rooksebecca Ger Ringenberg, MD, the attending physician have personally viewed and interpreted all ECGs.  None ____________________________________________  RADIOLOGY All Xrays were viewed by me. Imaging interpreted by Radiologist.  Chest x-ray two-view: Negative __________________________________________  PROCEDURES  Procedure(s) performed: None  Critical Care performed: None  ____________________________________________   ED COURSE / ASSESSMENT AND PLAN  Pertinent labs & imaging results that were available during my care of the patient were reviewed by me and considered in my medical decision making (see chart for details).   She received 2 DuoNeb, she is still wheezy. I am going to give her 2 additional DuoNeb's. I'm also  going to treat her with prednisone burst.  She has no hypoxia. I will recheck her respiratory status and make sure she can walk appropriately, the patient states she would like to be discharged.  After 2 more DuoNeb, she sounds a little better but still has a significant amount of wheezing. She states that she has been able to walk around but just very short distances. I discussed with her that although her O2 sat and vital signs are reassuring, the fact that she didn't have significant improvement makes me feel like I would prefer to recommend observation admission for asthma exacerbation.  Patient does not want to stay in the hospital. Ultimately, she does not have any tachypnea, retractions, or hypoxia, and I think by filling her prescription for albuterol both as negative as an inhaler as well as her prednisone, she should do okay at home. We discussed return precautions and she is prepared to come back if needed.  CONSULTATIONS:   None   Patient / Family / Caregiver informed of clinical course, medical decision-making process, and agree with plan.   I discussed return precautions, follow-up instructions, and discharge instructions with patient and/or family.   ___________________________________________   FINAL CLINICAL IMPRESSION(S) / ED DIAGNOSES   Final diagnoses:  Asthma exacerbation              Note: This dictation was prepared with Dragon dictation. Any transcriptional errors that result from this process are unintentional    Governor Rooksebecca Kaya Klausing, MD 09/23/15 1651

## 2017-01-12 LAB — HM PAP SMEAR: HM Pap smear: NORMAL

## 2018-10-12 ENCOUNTER — Emergency Department: Payer: Medicaid Other

## 2018-10-12 ENCOUNTER — Other Ambulatory Visit: Payer: Self-pay

## 2018-10-12 ENCOUNTER — Emergency Department
Admission: EM | Admit: 2018-10-12 | Discharge: 2018-10-13 | Disposition: A | Discharge status: Home / Self Care | Payer: Medicaid Other | Attending: Emergency Medicine | Admitting: Emergency Medicine

## 2018-10-12 DIAGNOSIS — Z87891 Personal history of nicotine dependence: Secondary | ICD-10-CM | POA: Diagnosis not present

## 2018-10-12 DIAGNOSIS — R112 Nausea with vomiting, unspecified: Secondary | ICD-10-CM | POA: Diagnosis present

## 2018-10-12 DIAGNOSIS — R197 Diarrhea, unspecified: Secondary | ICD-10-CM | POA: Insufficient documentation

## 2018-10-12 DIAGNOSIS — J45909 Unspecified asthma, uncomplicated: Secondary | ICD-10-CM | POA: Diagnosis not present

## 2018-10-12 DIAGNOSIS — Z79899 Other long term (current) drug therapy: Secondary | ICD-10-CM | POA: Diagnosis not present

## 2018-10-12 DIAGNOSIS — R1031 Right lower quadrant pain: Secondary | ICD-10-CM | POA: Diagnosis not present

## 2018-10-12 LAB — URINALYSIS, COMPLETE (UACMP) WITH MICROSCOPIC
Bilirubin Urine: NEGATIVE
Glucose, UA: NEGATIVE mg/dL
Hgb urine dipstick: NEGATIVE
Ketones, ur: NEGATIVE mg/dL
Nitrite: NEGATIVE
Protein, ur: NEGATIVE mg/dL
Specific Gravity, Urine: 1.028 (ref 1.005–1.030)
pH: 6 (ref 5.0–8.0)

## 2018-10-12 LAB — CBC
HCT: 42.1 % (ref 36.0–46.0)
Hemoglobin: 14.5 g/dL (ref 12.0–15.0)
MCH: 29.5 pg (ref 26.0–34.0)
MCHC: 34.4 g/dL (ref 30.0–36.0)
MCV: 85.6 fL (ref 80.0–100.0)
Platelets: 373 10*3/uL (ref 150–400)
RBC: 4.92 MIL/uL (ref 3.87–5.11)
RDW: 13.2 % (ref 11.5–15.5)
WBC: 10.9 10*3/uL — ABNORMAL HIGH (ref 4.0–10.5)
nRBC: 0 % (ref 0.0–0.2)

## 2018-10-12 LAB — COMPREHENSIVE METABOLIC PANEL
ALT: 52 U/L — ABNORMAL HIGH (ref 0–44)
AST: 29 U/L (ref 15–41)
Albumin: 4.6 g/dL (ref 3.5–5.0)
Alkaline Phosphatase: 67 U/L (ref 38–126)
Anion gap: 10 (ref 5–15)
BUN: 12 mg/dL (ref 6–20)
CO2: 22 mmol/L (ref 22–32)
Calcium: 9.9 mg/dL (ref 8.9–10.3)
Chloride: 109 mmol/L (ref 98–111)
Creatinine, Ser: 0.75 mg/dL (ref 0.44–1.00)
GFR calc Af Amer: >60 mL/min (ref >60)
GFR calc non Af Amer: >60 mL/min (ref >60)
Glucose, Bld: 105 mg/dL — ABNORMAL HIGH (ref 70–99)
Potassium: 4 mmol/L (ref 3.5–5.1)
Sodium: 141 mmol/L (ref 135–145)
Total Bilirubin: 0.9 mg/dL (ref 0.3–1.2)
Total Protein: 7.7 g/dL (ref 6.5–8.1)

## 2018-10-12 LAB — LIPASE, BLOOD: Lipase: 23 U/L (ref 11–51)

## 2018-10-12 MED ORDER — HYDROMORPHONE HCL 1 MG/ML IJ SOLN
0.5000 mg | Freq: Once | INTRAMUSCULAR | Status: AC
  Administered 2018-10-12: 0.5 mg via INTRAVENOUS
  Filled 2018-10-12: qty 1

## 2018-10-12 MED ORDER — ONDANSETRON HCL 4 MG/2ML IJ SOLN
4.0000 mg | Freq: Once | INTRAMUSCULAR | Status: AC
  Administered 2018-10-12: 4 mg via INTRAVENOUS
  Filled 2018-10-12: qty 2

## 2018-10-12 MED ORDER — SODIUM CHLORIDE 0.9% FLUSH
3.0000 mL | Freq: Once | INTRAVENOUS | Status: AC
  Administered 2018-10-12: 3 mL via INTRAVENOUS

## 2018-10-12 NOTE — ED Provider Notes (Signed)
[facility] Emergency Department Provider Note   ____________________________________________   First MD Initiated Contact with Patient 10/12/18 1757     (approximate)  I have reviewed the triage vital signs and the nursing notes.   HISTORY  Chief Complaint Abdominal Pain    HPI Lisa Sharp is a 38 y.o. female who reports she had nausea vomiting and diarrhea for several days and seemed to get better and she went to have a regular bowel movement and developed sudden onset of right lower quadrant pain.  Pain initially was around the umbilicus and quickly moved to the right lower quadrant.  Is worse with movement gradually increasing in intensity currently moderate to moderately severe.  Deep and achy.         Past Medical History:  Diagnosis Date  . Arthritis    left knee and hips  . Asthma   . Chronic bronchitis (HCC) 1992  . Complication of anesthesia    severe nausea and vomiting  . Family history of adverse reaction to anesthesia    severe nausea and vomiting  . GERD (gastroesophageal reflux disease)   . Guillain-Barre disease (HCC) 02/17/2003   B leg weakness  . Headache    migraines 1-2 per month  . Insomnia   . Orthostatic hypotension   . PONV (postoperative nausea and vomiting)   . Pseudopapilledema of both optic discs 1995  . RLS (restless legs syndrome)     There are no active problems to display for this patient.   Past Surgical History:  Procedure Laterality Date  . CHOLECYSTECTOMY  2009  . FINGER FRACTURE SURGERY    . GANGLION CYST EXCISION Left 06/12/2014   Procedure: REMOVAL GANGLION OF WRIST;  Surgeon: Kennedy Bucker, MD;  Location: [facility] ORS;  Service: Orthopedics;  Laterality: Left;  . KNEE ARTHROSCOPY Left 2005  . TONSILLECTOMY AND ADENOIDECTOMY Bilateral 08/17/2014   Procedure: TONSILLECTOMY AND ADENOIDECTOMY;  Surgeon: Linus Salmons, MD;  Location: [facility];  Service: ENT;  Laterality: Bilateral;  . TUBAL  LIGATION Bilateral 2010    Prior to Admission medications   Medication Sig Start Date End Date Taking? Authorizing Provider  acetaminophen (TYLENOL) 500 MG tablet Take 500 mg by mouth every 6 (six) hours as needed for headache.    [provider]  albuterol (PROVENTIL HFA;VENTOLIN HFA) 108 (90 Base) MCG/ACT inhaler Inhale 2 puffs into the lungs every 6 (six) hours as needed for wheezing or shortness of breath. 09/23/15   Governor Rooks, MD  albuterol (PROVENTIL) (2.5 MG/3ML) 0.083% nebulizer solution Take 3 mLs (2.5 mg total) by nebulization every 4 (four) hours as needed for wheezing or shortness of breath. 09/23/15   Governor Rooks, MD  amoxicillin-clavulanate (AUGMENTIN) 875-125 MG per tablet Take 1 tablet by mouth 2 (two) times daily.    [provider]  benzonatate (TESSALON) 100 MG capsule Take by mouth 3 (three) times daily as needed for cough.    [provider]  fluticasone (FLOVENT HFA) 220 MCG/ACT inhaler Inhale into the lungs 2 (two) times daily.    [provider]  Fluticasone-Salmeterol (ADVAIR) 100-50 MCG/DOSE AEPB Inhale 1 puff into the lungs 2 (two) times daily. Pt uses this med once a day, makes my throat sore    [provider]  gabapentin (NEURONTIN) 600 MG tablet Take 600 mg by mouth 2 (two) times daily.    [provider]  HYDROcodone-acetaminophen (HYCET) 7.5-325 mg/15 ml solution Take 15 mLs by mouth every 4 (four) hours  as needed for moderate pain. Patient not taking: Reported on 10/12/2018 08/17/14   Linus SalmonsMcQueen, Chapman, MD  HYDROcodone-acetaminophen [facility]) 5-325 MG per tablet Take 1-2 tablets by mouth every 6 (six) hours as needed for moderate pain. Patient not taking: Reported on 10/12/2018 06/12/14   Kennedy BuckerMenz, Michael, MD  ibuprofen (ADVIL,MOTRIN) 800 MG tablet Take 800 mg by mouth 2 (two) times daily.    [provider]  montelukast (SINGULAIR) 10 MG tablet Take 10 mg by mouth at bedtime.    [provider]   omeprazole (PRILOSEC) 20 MG capsule Take 20 mg by mouth every morning.    [provider]  ondansetron (ZOFRAN ODT) 4 MG disintegrating tablet Take 1 tablet (4 mg total) by mouth every 8 (eight) hours as needed for nausea or vomiting. Patient not taking: Reported on 10/12/2018 06/12/14   Kennedy BuckerMenz, Michael, MD  oxybutynin (DITROPAN) 5 MG tablet Take 5 mg by mouth every morning.    [provider]  oxyCODONE-acetaminophen (ROXICET) 5-325 MG tablet Take 1-2 tablets by mouth every 6 (six) hours as needed. Patient not taking: Reported on 10/12/2018 12/16/14   Myrna BlazerSchaevitz, David Matthew, MD  predniSONE (DELTASONE) 10 MG tablet 60 mg by mouth daily for 4 more days Patient not taking: Reported on 10/12/2018 09/23/15   Governor RooksLord, Rebecca, MD    Allergies Aspirin; Petroleum jelly [skin protectants, misc.]; and Verapamil hcl er  No family history on file.  Social History Social History   Tobacco Use  . Smoking status: Former Smoker    Packs/day: 1.00    Years: 5.00    Pack years: 5.00  . Smokeless tobacco: Never Used  Substance Use Topics  . Alcohol use: Not Currently    Alcohol/week: 1.0 standard drinks    Types: 1 Glasses of wine per week  . Drug use: No    Review of Systems  Constitutional: No fever/chills Eyes: No visual changes. ENT: No sore throat. Cardiovascular: Denies chest pain. Respiratory: Denies shortness of breath. Gastrointestinal:  abdominal pain.   nausea, no vomiting.  No diarrhea.  No constipation. Genitourinary: Negative for dysuria. Musculoskeletal: Negative for back pain. Skin: Negative for rash. Neurological: Negative for headaches, focal weakness  ____________________________________________   PHYSICAL EXAM:  VITAL SIGNS: ED Triage Vitals  Enc Vitals Group     BP 10/12/18 1640 132/89     Pulse Rate 10/12/18 1640 (!) 116     Resp 10/12/18 1640 18     Temp 10/12/18 1640 97.9 F (36.6 C)     Temp Source 10/12/18 1640 Oral     SpO2 10/12/18 1640 97  %     Weight 10/12/18 1641 290 lb (131.5 kg)     Height 10/12/18 1641 5\' 7"  (1.702 m)     Head Circumference --      Peak Flow --      Pain Score 10/12/18 1640 6     Pain Loc --      Pain Edu? --      Excl. in GC? --     Constitutional: Alert and oriented. Well appearing and in no acute distress. Eyes: Conjunctivae are normal Head: Atraumatic. Nose: No congestion/rhinnorhea. Mouth/Throat: Mucous membranes are moist.  Oropharynx non-erythematous. Neck: No stridor.  Cardiovascular: Normal rate, regular rhythm. Grossly normal heart sounds.  Good peripheral circulation. Respiratory: Normal respiratory effort.  No retractions. Lungs CTAB. Gastrointestinal: Soft and nontender except tender to palpation in the low right lower quadrant. No distention. No abdominal bruits. No CVA tenderness. Musculoskeletal: No lower  extremity tenderness nor edema.  No joint effusions. Neurologic:  Normal speech and language. No gross focal neurologic deficits are appreciated Skin:  Skin is warm, dry and intact. No rash noted.   ____________________________________________   LABS (all labs ordered are listed, but only abnormal results are displayed)  Labs Reviewed  COMPREHENSIVE METABOLIC PANEL - Abnormal; Notable for the following components:      Result Value   Glucose, Bld 105 (*)    ALT 52 (*)    All other components within normal limits  CBC - Abnormal; Notable for the following components:   WBC 10.9 (*)    All other components within normal limits  URINALYSIS, COMPLETE (UACMP) WITH MICROSCOPIC - Abnormal; Notable for the following components:   Color, Urine YELLOW (*)    APPearance HAZY (*)    Leukocytes,Ua TRACE (*)    Bacteria, UA RARE (*)    All other components within normal limits  LIPASE, BLOOD   ____________________________________________  EKG  ____________________________________________  RADIOLOGY  ED MD interpretation: CT read by radiology reviewed by me does not show  any sign of renal stones.  Official radiology report(s): Ct Renal Stone Study  Result Date: 10/12/2018 CLINICAL DATA:  Right lower quadrant pain and right flank pain. EXAM: CT ABDOMEN AND PELVIS WITHOUT CONTRAST TECHNIQUE: Multidetector CT imaging of the abdomen and pelvis was performed following the standard protocol without IV contrast. COMPARISON:  12/16/2014 FINDINGS: Lower chest: No acute findings. Hepatobiliary: No mass visualized on this unenhanced exam. Moderate diffuse hepatic steatosis noted with focal fatty sparing centrally. Prior cholecystectomy. No evidence of biliary obstruction. Pancreas: No mass or inflammatory process visualized on this unenhanced exam. Spleen:  Within normal limits in size. Adrenals/Urinary tract: No evidence of urolithiasis or hydronephrosis. Unremarkable unopacified urinary bladder. Stomach/Bowel: No evidence of obstruction, inflammatory process, or abnormal fluid collections. Normal appendix visualized. Vascular/Lymphatic: No pathologically enlarged lymph nodes identified. No evidence of abdominal aortic aneurysm. Reproductive:  No mass or other significant abnormality. Other:  None. Musculoskeletal:  No suspicious bone lesions identified. IMPRESSION: No evidence of urolithiasis, hydronephrosis, or other acute findings. Moderate hepatic steatosis. Electronically Signed   By: Marlaine Hind M.D.   On: 10/12/2018 18:52    ____________________________________________   PROCEDURES  Procedure(s) performed (including Critical Care):  Procedures   ____________________________________________   INITIAL IMPRESSION / ASSESSMENT AND PLAN / ED COURSE SARINA ROBLETO was evaluated in Emergency Department on 10/12/2018 for the symptoms described in the history of present illness. She was evaluated in the context of the global COVID-19 pandemic, which necessitated consideration that the patient might be at risk for infection with the SARS-CoV-2 virus that causes COVID-19.  Institutional protocols and algorithms that pertain to the evaluation of patients at risk for COVID-19 are in a state of rapid change based on information released by regulatory bodies including the CDC and federal and state organizations. These policies and algorithms were followed during the patient's care in the ED.    CT is negative.  I am signing the patient out to Dr. Charna Archer pending results of the ultrasound.          ____________________________________________   FINAL CLINICAL IMPRESSION(S) / ED DIAGNOSES  Final diagnoses:  Right lower quadrant abdominal pain     ED Discharge Orders    None       Note:  This document was prepared using Dragon voice recognition software and may include unintentional dictation errors.    Nena Polio, MD 10/12/18 2038

## 2018-10-12 NOTE — ED Notes (Signed)
C/o of pain 10/10 to right lower quad. md aware. Pain meds given as ordered.

## 2018-10-12 NOTE — ED Notes (Signed)
Returned from u/s

## 2018-10-12 NOTE — ED Notes (Signed)
patieent report abd pain point to right lower quad describes pain as a knife in there.

## 2018-10-12 NOTE — ED Provider Notes (Signed)
-----------------------------------------   8:28 PM on 10/12/2018 -----------------------------------------  Blood pressure 128/76, pulse 100, temperature 97.9 F (36.6 C), temperature source Oral, resp. rate 18, height 5\' 7"  (1.702 m), weight 131.5 kg, last menstrual period 10/12/2018, SpO2 98 %.  Assuming care from Dr. Cinda Quest.  In short, Lisa Sharp is a 38 y.o. female with a chief complaint of Abdominal Pain .  Refer to the original H&P for additional details.  The current plan of care is to follow-up pelvic US, CT ab/pel negative for acute process.  Pelvic ultrasound shows right ovarian cyst without evidence of torsion or other concerning pathology.  Unable to visualize left ovary, however given her pain is located on the right, doubt to left ovarian pathology.  Counseled patient to follow-up with her PCP and OB/GYN, otherwise return to the ED for new or worsening symptoms.  Patient agrees with plan.    Blake Divine, MD 10/13/18 [phone]

## 2018-10-12 NOTE — ED Triage Notes (Signed)
Pt to the er for rlq pain that started after she had a BM. Pt just getting over a stomach virus. Pt says the pain is worse with sitting. Pt has a hx of ovarian cysts. LMP approx 18 days ago

## 2018-10-12 NOTE — ED Notes (Signed)
Off uni to u/s

## 2018-10-12 NOTE — ED Notes (Signed)
Awaiting u/s. Vss. Pain decreased after meds.

## 2018-11-02 ENCOUNTER — Ambulatory Visit (INDEPENDENT_AMBULATORY_CARE_PROVIDER_SITE_OTHER): Payer: Medicaid Other | Admitting: Obstetrics and Gynecology

## 2018-11-02 ENCOUNTER — Encounter: Payer: Self-pay | Admitting: Obstetrics and Gynecology

## 2018-11-02 ENCOUNTER — Other Ambulatory Visit: Payer: Self-pay

## 2018-11-02 ENCOUNTER — Other Ambulatory Visit (HOSPITAL_COMMUNITY)
Admission: RE | Admit: 2018-11-02 | Discharge: 2018-11-02 | Disposition: A | Discharge status: Home / Self Care | Payer: Medicaid Other | Source: Ambulatory Visit | Attending: Obstetrics and Gynecology | Admitting: Obstetrics and Gynecology

## 2018-11-02 VITALS — BP 124/78 | Ht 67.0 in | Wt 290.0 lb

## 2018-11-02 DIAGNOSIS — N920 Excessive and frequent menstruation with regular cycle: Secondary | ICD-10-CM | POA: Insufficient documentation

## 2018-11-02 DIAGNOSIS — Z113 Encounter for screening for infections with a predominantly sexual mode of transmission: Secondary | ICD-10-CM

## 2018-11-02 DIAGNOSIS — N83201 Unspecified ovarian cyst, right side: Secondary | ICD-10-CM

## 2018-11-02 NOTE — Progress Notes (Signed)
Obstetrics & Gynecology Office Visit   Chief Complaint  Patient presents with  . Follow-up  . Ovarian Cyst    History of Present Illness: 38 y.o. [id number] female who presents in follow up from an ER visit on 10/12/2018.  She presented to the ER due to an acute onset sharp stabbing pain on her right lower abdomen.  She had a negative CT tow "very large" cysts on her right ovary. She was told that one of them had ruptured.  She had a previous episode of a rupture ovarian cyst.   Her cycles are normally 25 days. After this episode she had an early period (10 days early). Once her period started the pain returned and it has now gone away.    In review of the pelvic ultrasound images, there was a right ovarian cyst vs follicle that was just under 3 cm.  Her pain is not present today.  Her last pap smear was in 01/2017. She states the pap smear was normal.  She notes that she had an abnormal pap smear in 2009 and this was repeated a month later and it was normal.  She denies a history of STDs. She states that she is polyamorous and her last test was in 01/2017.     Normally her periods are monthly, coming every 25 days, lasting about 1 week. She bleeds very heavy "like I'm dying" for three days.  This was like a miscarriage.  She is a [id number].      Past Medical History:  Diagnosis Date  . Arthritis    left knee and hips  . Asthma   . Chronic bronchitis ([place]) 1992  . Complication of anesthesia    severe nausea and vomiting  . Family history of adverse reaction to anesthesia    severe nausea and vomiting  . GERD (gastroesophageal reflux disease)   . Guillain-Barre disease ([place]) 02/17/2003   B leg weakness  . Headache    migraines 1-2 per month  . Insomnia   . Orthostatic hypotension   . PONV (postoperative nausea and vomiting)   . Pseudopapilledema of both optic discs 1995  . RLS (restless legs syndrome)     Past Surgical History:  Procedure Laterality Date  . CHOLECYSTECTOMY  2009  .  FINGER FRACTURE SURGERY    . GANGLION CYST EXCISION Left 06/12/2014   Procedure: REMOVAL GANGLION OF WRIST;  Surgeon: Hessie Knows, MD;  Location: [facility];  Service: Orthopedics;  Laterality: Left;  . KNEE ARTHROSCOPY Left 2005  . TONSILLECTOMY AND ADENOIDECTOMY Bilateral 08/17/2014   Procedure: TONSILLECTOMY AND ADENOIDECTOMY;  Surgeon: Beverly Gust, MD;  Location: [place];  Service: ENT;  Laterality: Bilateral;  . TUBAL LIGATION Bilateral 2010    Gynecologic History: Patient's last menstrual period was 10/12/2018.  Obstetric History: [id number]  Family History  Problem Relation Age of Onset  . Fibroids Mother   . Fibroids Maternal Grandmother   . Uterine cancer Maternal Grandmother   . Vaginal cancer Paternal Grandmother     Social History   Socioeconomic History  . Marital status: Divorced    Spouse name: Not on file  . Number of children: Not on file  . Years of education: Not on file  . Highest education level: Not on file  Occupational History  . Not on file  Social Needs  . Financial resource strain: Not on file  . Food insecurity    Worry: Not on file    Inability: Not on file  .  Transportation needs    Medical: Not on file    Non-medical: Not on file  Tobacco Use  . Smoking status: Former Smoker    Packs/day: 1.00    Years: 5.00    Pack years: 5.00  . Smokeless tobacco: Never Used  Substance and Sexual Activity  . Alcohol use: Not Currently    Alcohol/week: 1.0 standard drinks    Types: 1 Glasses of wine per week  . Drug use: No  . Sexual activity: Yes    Birth control/protection: Surgical    Comment: BTL  Lifestyle  . Physical activity    Days per week: Not on file    Minutes per session: Not on file  . Stress: Not on file  Relationships  . Social Musicianconnections    Talks on phone: Not on file    Gets together: Not on file    Attends religious service: Not on file    Active member of club or organization: Not on file    Attends meetings  of clubs or organizations: Not on file    Relationship status: Not on file  . Intimate partner violence    Fear of current or ex partner: Not on file    Emotionally abused: Not on file    Physically abused: Not on file    Forced sexual activity: Not on file  Other Topics Concern  . Not on file  Social History Narrative  . Not on file    Allergies  Allergen Reactions  . Aspirin Anaphylaxis  . Petroleum Jelly [Skin Protectants, Misc.] Other (See Comments)    Causes skin burn  . Verapamil Hcl Er Other (See Comments)    Extreme muscle pain    Prior to Admission medications   Medication Sig Start Date End Date Taking? Authorizing Provider  albuterol (PROVENTIL HFA;VENTOLIN HFA) 108 (90 Base) MCG/ACT inhaler Inhale 2 puffs into the lungs every 6 (six) hours as needed for wheezing or shortness of breath. 09/23/15  Yes Governor RooksLord, Rebecca, MD  albuterol (PROVENTIL) (2.5 MG/3ML) 0.083% nebulizer solution Take 3 mLs (2.5 mg total) by nebulization every 4 (four) hours as needed for wheezing or shortness of breath. 09/23/15  Yes Governor RooksLord, Rebecca, MD  omeprazole (PRILOSEC) 20 MG capsule Take 20 mg by mouth every morning.   Yes [provider]    Review of Systems  Constitutional: Negative.   HENT: Negative.   Eyes: Negative.   Respiratory: Negative.   Cardiovascular: Negative.   Gastrointestinal: Negative.   Genitourinary: Negative.   Musculoskeletal: Negative.   Skin: Negative.   Neurological: Negative.   Psychiatric/Behavioral: Negative.      Physical Exam BP 124/78   Ht 5\' 7"  (1.702 m)   Wt 290 lb (131.5 kg)   LMP 10/12/2018 Comment: 3 weeks ago tubligation.  BMI 45.42 kg/m  Patient's last menstrual period was 10/12/2018. Physical Exam Constitutional:      General: She is not in acute distress.    Appearance: Normal appearance.  Genitourinary:     Pelvic exam was performed with patient in the lithotomy position.     Vulva, inguinal canal, urethra, bladder, vagina, uterus,  right adnexa and left adnexa normal.     No cervical motion tenderness, lesion or polyp.     Uterus is mobile.     Uterus is not enlarged, tender or irregular.     No uterine mass detected. HENT:     Head: Normocephalic and atraumatic.  Eyes:     General: No scleral icterus.  Conjunctiva/sclera: Conjunctivae normal.  Neurological:     General: No focal deficit present.     Mental Status: She is alert and oriented to person, place, and time.     Cranial Nerves: No cranial nerve deficit.  Psychiatric:        Mood and Affect: Mood normal.        Behavior: Behavior normal.        Judgment: Judgment normal.  Vitals signs reviewed.    Female chaperone present for pelvic and breast  portions of the physical exam  Assessment: 38 y.o. [id number] female here for  1. Menorrhagia with regular cycle   2. Right ovarian cyst   3. Screen for STD (sexually transmitted disease)      Plan: Problem List Items Addressed This Visit    None    Visit Diagnoses    Menorrhagia with regular cycle    -  Primary   Relevant Orders   Cytology - PAP   Right ovarian cyst       Screen for STD (sexually transmitted disease)       Relevant Orders   Cytology - PAP     Patient reassured regarding right ovarian cyst.  Characteristics upon review of images most consistent with corpus luteal cyst or simple cyst that is less than 3 cm in size.  Discussed that we could repeat ultrasound in 4 to 6 weeks to assess for resolution.  Patient states that she feels comfortable without follow-up in this regard unless she has ongoing pain.  For menorrhagia, we discussed potential treatment options, which include; no treatment, treatment with medication such as an IUD, surgical options which could include ablation or hysterectomy.  The patient is highly against surgery at this point in her life.  I think that this is a good and healthy approach to surgery.  I certainly would not start with a surgical option.  We did discuss  an IUD and she will consider and decide whether she would like to try one for her periods which have been getting heavier and heavier more recently.  30 minutes spent in face to face discussion with > 50% spent in counseling,management, and coordination of care of her menorrhagia with regular cycle and right ovarian cyst.   Thomasene Mohair, MD 11/02/2018 5:10 PM

## 2018-11-07 ENCOUNTER — Encounter: Payer: Self-pay | Admitting: Obstetrics and Gynecology

## 2018-11-07 LAB — CYTOLOGY - PAP
Chlamydia: NEGATIVE
Comment: NEGATIVE
Comment: NEGATIVE
Comment: NORMAL
Diagnosis: NEGATIVE
High risk HPV: NEGATIVE
Neisseria Gonorrhea: NEGATIVE
Trichomonas: NEGATIVE

## 2019-11-08 ENCOUNTER — Other Ambulatory Visit (HOSPITAL_COMMUNITY): Payer: Self-pay | Admitting: Orthopedic Surgery

## 2019-11-08 ENCOUNTER — Other Ambulatory Visit: Payer: Self-pay | Admitting: Orthopedic Surgery

## 2019-11-08 DIAGNOSIS — S83006A Unspecified dislocation of unspecified patella, initial encounter: Secondary | ICD-10-CM

## 2019-11-08 DIAGNOSIS — M1712 Unilateral primary osteoarthritis, left knee: Secondary | ICD-10-CM

## 2019-11-08 DIAGNOSIS — M222X2 Patellofemoral disorders, left knee: Secondary | ICD-10-CM

## 2019-11-09 ENCOUNTER — Ambulatory Visit
Admission: RE | Admit: 2019-11-09 | Discharge: 2019-11-09 | Disposition: A | Discharge status: Home / Self Care | Payer: Medicaid Other | Source: Ambulatory Visit | Attending: Orthopedic Surgery | Admitting: Orthopedic Surgery

## 2019-11-09 ENCOUNTER — Other Ambulatory Visit: Payer: Self-pay

## 2019-11-09 DIAGNOSIS — M1712 Unilateral primary osteoarthritis, left knee: Secondary | ICD-10-CM | POA: Diagnosis present

## 2019-11-09 DIAGNOSIS — S83006A Unspecified dislocation of unspecified patella, initial encounter: Secondary | ICD-10-CM | POA: Diagnosis present

## 2019-11-09 DIAGNOSIS — M222X2 Patellofemoral disorders, left knee: Secondary | ICD-10-CM | POA: Insufficient documentation

## 2019-11-20 ENCOUNTER — Other Ambulatory Visit: Payer: Self-pay | Admitting: Orthopedic Surgery

## 2019-11-20 DIAGNOSIS — G8929 Other chronic pain: Secondary | ICD-10-CM

## 2019-11-20 DIAGNOSIS — M25562 Pain in left knee: Secondary | ICD-10-CM

## 2019-11-20 NOTE — Progress Notes (Signed)
Bilateral standing hip to ankle x-rays on one cassette for evaluation of alignment 

## 2019-12-12 ENCOUNTER — Other Ambulatory Visit: Payer: Self-pay | Admitting: Orthopedic Surgery

## 2019-12-20 ENCOUNTER — Encounter
Admission: RE | Admit: 2019-12-20 | Discharge: 2019-12-20 | Disposition: A | Discharge status: Home / Self Care | Payer: Medicaid Other | Source: Ambulatory Visit | Attending: Orthopedic Surgery | Admitting: Orthopedic Surgery

## 2019-12-20 HISTORY — DX: Anemia, unspecified: D64.9

## 2019-12-20 HISTORY — DX: Dyspnea, unspecified: R06.00

## 2019-12-20 NOTE — Patient Instructions (Addendum)
Your procedure is scheduled on: 12/25/19- Monday Report to the Registration Desk on the 1st floor of the [facility]. To find out your arrival time, please call [phone] between 1PM - 3PM on: 12/22/19- Friday  REMEMBER: Instructions that are not followed completely may result in serious medical risk, up to and including death; or upon the discretion of your surgeon and anesthesiologist your surgery may need to be rescheduled.  Do not eat food after midnight the night before surgery.  No gum chewing, lozengers or hard candies.  You may however, drink CLEAR liquids up to 2 hours before you are scheduled to arrive for your surgery. Do not drink anything within 2 hours of your scheduled arrival time.  Clear liquids include: - water  - apple juice without pulp - gatorade (not RED, PURPLE, OR BLUE) - black coffee or tea (Do NOT add milk or creamers to the coffee or tea) Do NOT drink anything that is not on this list.   TAKE THESE MEDICATIONS THE MORNING OF SURGERY WITH A SIP OF WATER: - cetirizine (ZYRTEC) 10 MG tablet - famotidine (PEPCID) 20 MG tablet, (take one the night before and one on the morning of surgery - helps to prevent nausea after surgery.) - fluticasone (FLONASE) 50 MCG/ACT nasal spray - Fluticasone-Salmeterol (ADVAIR) 250-50 MCG/DOSE AEPB - oxybutynin (DITROPAN) 5 MG tablet  Use inhaler albuterol (PROVENTIL HFA;VENTOLIN HFA) 108 (90 Base) MCG/ACT inhaler on the day of surgery and bring to the hospital.  One week prior to surgery: 12/20/19  Stop Anti-inflammatories (NSAIDS) such as Advil, Aleve, Ibuprofen, Motrin, Naproxen, Naprosyn and Aspirin based products such as Excedrin, Goodys Powder, BC Powder.  Stop ANY OVER THE COUNTER supplements until after surgery. (However, you may continue taking Vitamin D, Vitamin B, and multivitamin up until the day before surgery.)  No Alcohol for 24 hours before or after surgery.  No Smoking including e-cigarettes for 24 hours  prior to surgery.  No chewable tobacco products for at least 6 hours prior to surgery.  No nicotine patches on the day of surgery.  Do not use any "recreational" drugs for at least a week prior to your surgery.  Please be advised that the combination of cocaine and anesthesia may have negative outcomes, up to and including death. If you test positive for cocaine, your surgery will be cancelled.  On the morning of surgery brush your teeth with toothpaste and water, you may rinse your mouth with mouthwash if you wish. Do not swallow any toothpaste or mouthwash.  Do not wear jewelry, make-up, hairpins, clips or nail polish.  Do not wear lotions, powders, or perfumes.   Do not shave body from the neck down 48 hours prior to surgery just in case you cut yourself which could leave a site for infection.  Also, freshly shaved skin may become irritated if using the CHG soap.  Contact lenses, hearing aids and dentures may not be worn into surgery.  Do not bring valuables to the hospital. [facility] is not responsible for any missing/lost belongings or valuables.   Use CHG Soap or wipes as directed on instruction sheet.  Notify your doctor if there is any change in your medical condition (cold, fever, infection).  Wear comfortable clothing (specific to your surgery type) to the hospital.  Plan for stool softeners for home use; pain medications have a tendency to cause constipation. You can also help prevent constipation by eating foods high in fiber such as fruits and vegetables and drinking plenty  of fluids as your diet allows.  After surgery, you can help prevent lung complications by doing breathing exercises.  Take deep breaths and cough every 1-2 hours. Your doctor may order a device called an Incentive Spirometer to help you take deep breaths. When coughing or sneezing, hold a pillow firmly against your incision with both hands. This is called "splinting." Doing this helps protect your  incision. It also decreases belly discomfort.  If you are being admitted to the hospital overnight, leave your suitcase in the car. After surgery it may be brought to your room.  If you are being discharged the day of surgery, you will not be allowed to drive home. You will need a responsible adult (18 years or older) to drive you home and stay with you that night.   If you are taking public transportation, you will need to have a responsible adult (18 years or older) with you. Please confirm with your physician that it is acceptable to use public transportation.   Please call the [facility] at [phone] if you have any questions about these instructions.  Visitation Policy:  Patients undergoing a surgery or procedure may have one family member or support person with them as long as that person is not COVID-19 positive or experiencing its symptoms.  That person may remain in the waiting area during the procedure.  Inpatient Visitation Update:   In an effort to ensure the safety of our team members and our patients, we are implementing a change to our visitation policy:  Effective Monday, Aug. 9, at 7 a.m., inpatients will be allowed one support person.  o The support person may change daily.  o The support person must pass our screening, gel in and out, and wear a mask at all times, including in the patient's room.  o Patients must also wear a mask when staff or their support person are in the room.  o Masking is required regardless of vaccination status.  Systemwide, no visitors 17 or younger.

## 2019-12-21 ENCOUNTER — Encounter
Admission: RE | Admit: 2019-12-21 | Discharge: 2019-12-21 | Disposition: A | Discharge status: Home / Self Care | Payer: Medicaid Other | Source: Ambulatory Visit | Attending: Orthopedic Surgery | Admitting: Orthopedic Surgery

## 2019-12-21 ENCOUNTER — Other Ambulatory Visit: Payer: Self-pay

## 2019-12-21 DIAGNOSIS — Z20822 Contact with and (suspected) exposure to covid-19: Secondary | ICD-10-CM | POA: Insufficient documentation

## 2019-12-21 DIAGNOSIS — Z01818 Encounter for other preprocedural examination: Secondary | ICD-10-CM | POA: Insufficient documentation

## 2019-12-21 LAB — BASIC METABOLIC PANEL
Anion gap: 10 (ref 5–15)
BUN: 13 mg/dL (ref 6–20)
CO2: 24 mmol/L (ref 22–32)
Calcium: 9.3 mg/dL (ref 8.9–10.3)
Chloride: 106 mmol/L (ref 98–111)
Creatinine, Ser: 0.87 mg/dL (ref 0.44–1.00)
GFR, Estimated: >60 mL/min (ref >60)
Glucose, Bld: 103 mg/dL — ABNORMAL HIGH (ref 70–99)
Potassium: 4 mmol/L (ref 3.5–5.1)
Sodium: 140 mmol/L (ref 135–145)

## 2019-12-21 LAB — CBC
HCT: 38.5 % (ref 36.0–46.0)
Hemoglobin: 12.9 g/dL (ref 12.0–15.0)
MCH: 29.7 pg (ref 26.0–34.0)
MCHC: 33.5 g/dL (ref 30.0–36.0)
MCV: 88.7 fL (ref 80.0–100.0)
Platelets: 348 10*3/uL (ref 150–400)
RBC: 4.34 MIL/uL (ref 3.87–5.11)
RDW: 13.1 % (ref 11.5–15.5)
WBC: 6.9 10*3/uL (ref 4.0–10.5)
nRBC: 0 % (ref 0.0–0.2)

## 2019-12-21 LAB — TYPE AND SCREEN
ABO/RH(D): O POS
Antibody Screen: NEGATIVE

## 2019-12-21 LAB — SURGICAL PCR SCREEN
MRSA, PCR: NEGATIVE
Staphylococcus aureus: NEGATIVE

## 2019-12-22 ENCOUNTER — Ambulatory Visit
Admission: RE | Admit: 2019-12-22 | Discharge: 2019-12-22 | Disposition: A | Discharge status: Home / Self Care | Payer: Medicaid Other | Source: Ambulatory Visit | Attending: Orthopedic Surgery | Admitting: Orthopedic Surgery

## 2019-12-22 ENCOUNTER — Other Ambulatory Visit: Payer: Self-pay

## 2019-12-22 DIAGNOSIS — M25562 Pain in left knee: Secondary | ICD-10-CM | POA: Insufficient documentation

## 2019-12-22 DIAGNOSIS — G8929 Other chronic pain: Secondary | ICD-10-CM | POA: Diagnosis present

## 2019-12-22 LAB — SARS CORONAVIRUS 2 (TAT 6-24 HRS): SARS Coronavirus 2: NEGATIVE

## 2019-12-25 ENCOUNTER — Ambulatory Visit: Payer: Medicaid Other

## 2019-12-25 ENCOUNTER — Encounter
Admission: RE | Disposition: A | Discharge status: Home / Self Care | Payer: Self-pay | Source: Home / Self Care | Attending: Orthopedic Surgery

## 2019-12-25 ENCOUNTER — Encounter: Payer: Self-pay | Admitting: Orthopedic Surgery

## 2019-12-25 ENCOUNTER — Ambulatory Visit: Payer: Medicaid Other | Admitting: Anesthesiology

## 2019-12-25 ENCOUNTER — Other Ambulatory Visit: Payer: Self-pay

## 2019-12-25 ENCOUNTER — Ambulatory Visit
Admission: RE | Admit: 2019-12-25 | Discharge: 2019-12-25 | Disposition: A | Discharge status: Home / Self Care | Payer: Medicaid Other | Attending: Orthopedic Surgery | Admitting: Orthopedic Surgery

## 2019-12-25 DIAGNOSIS — Z800 Family history of malignant neoplasm of digestive organs: Secondary | ICD-10-CM | POA: Diagnosis not present

## 2019-12-25 DIAGNOSIS — Z7951 Long term (current) use of inhaled steroids: Secondary | ICD-10-CM | POA: Diagnosis not present

## 2019-12-25 DIAGNOSIS — Z888 Allergy status to other drugs, medicaments and biological substances status: Secondary | ICD-10-CM | POA: Insufficient documentation

## 2019-12-25 DIAGNOSIS — M2419 Other articular cartilage disorders, other specified site: Secondary | ICD-10-CM | POA: Insufficient documentation

## 2019-12-25 DIAGNOSIS — Z886 Allergy status to analgesic agent status: Secondary | ICD-10-CM | POA: Insufficient documentation

## 2019-12-25 DIAGNOSIS — Z803 Family history of malignant neoplasm of breast: Secondary | ICD-10-CM | POA: Insufficient documentation

## 2019-12-25 DIAGNOSIS — Z8042 Family history of malignant neoplasm of prostate: Secondary | ICD-10-CM | POA: Diagnosis not present

## 2019-12-25 DIAGNOSIS — G650 Sequelae of Guillain-Barre syndrome: Secondary | ICD-10-CM | POA: Insufficient documentation

## 2019-12-25 DIAGNOSIS — R52 Pain, unspecified: Secondary | ICD-10-CM

## 2019-12-25 DIAGNOSIS — Z87891 Personal history of nicotine dependence: Secondary | ICD-10-CM | POA: Diagnosis not present

## 2019-12-25 DIAGNOSIS — Z808 Family history of malignant neoplasm of other organs or systems: Secondary | ICD-10-CM | POA: Insufficient documentation

## 2019-12-25 DIAGNOSIS — Z8249 Family history of ischemic heart disease and other diseases of the circulatory system: Secondary | ICD-10-CM | POA: Diagnosis not present

## 2019-12-25 DIAGNOSIS — M238X2 Other internal derangements of left knee: Secondary | ICD-10-CM | POA: Diagnosis not present

## 2019-12-25 DIAGNOSIS — G5793 Unspecified mononeuropathy of bilateral lower limbs: Secondary | ICD-10-CM | POA: Diagnosis not present

## 2019-12-25 DIAGNOSIS — M25462 Effusion, left knee: Secondary | ICD-10-CM | POA: Diagnosis not present

## 2019-12-25 DIAGNOSIS — Z833 Family history of diabetes mellitus: Secondary | ICD-10-CM | POA: Diagnosis not present

## 2019-12-25 DIAGNOSIS — M2202 Recurrent dislocation of patella, left knee: Secondary | ICD-10-CM | POA: Diagnosis not present

## 2019-12-25 DIAGNOSIS — M7122 Synovial cyst of popliteal space [Baker], left knee: Secondary | ICD-10-CM | POA: Insufficient documentation

## 2019-12-25 DIAGNOSIS — Z79899 Other long term (current) drug therapy: Secondary | ICD-10-CM | POA: Insufficient documentation

## 2019-12-25 DIAGNOSIS — M23242 Derangement of anterior horn of lateral meniscus due to old tear or injury, left knee: Secondary | ICD-10-CM | POA: Diagnosis not present

## 2019-12-25 HISTORY — PX: KNEE ARTHROSCOPY WITH PATELLA RECONSTRUCTION: SHX5654

## 2019-12-25 LAB — ABO/RH: ABO/RH(D): O POS

## 2019-12-25 LAB — POCT PREGNANCY, URINE: Preg Test, Ur: NEGATIVE

## 2019-12-25 SURGERY — ARTHROSCOPY, KNEE, WITH PATELLAR RECONSTRUCTION
Anesthesia: General | Site: Knee | Laterality: Left

## 2019-12-25 MED ORDER — CHLORHEXIDINE GLUCONATE 0.12 % MT SOLN: 15.0000 mL | Freq: Once | OROMUCOSAL | Status: AC

## 2019-12-25 MED ORDER — PROPOFOL 500 MG/50ML IV EMUL
INTRAVENOUS | Status: AC
  Filled 2019-12-25: qty 50

## 2019-12-25 MED ORDER — ENOXAPARIN SODIUM 40 MG/0.4ML ~~LOC~~ SOLN: 40.0000 mg | SUBCUTANEOUS | 0 refills | Status: DC

## 2019-12-25 MED ORDER — PROPOFOL 10 MG/ML IV BOLUS
INTRAVENOUS | Status: AC
  Filled 2019-12-25: qty 20

## 2019-12-25 MED ORDER — CHLORHEXIDINE GLUCONATE 0.12 % MT SOLN
OROMUCOSAL | Status: AC
  Administered 2019-12-25: 10:00:00 15 mL via OROMUCOSAL
  Filled 2019-12-25: qty 15

## 2019-12-25 MED ORDER — IPRATROPIUM-ALBUTEROL 0.5-2.5 (3) MG/3ML IN SOLN
RESPIRATORY_TRACT | Status: AC
  Administered 2019-12-25: 17:00:00 3 mL
  Filled 2019-12-25: qty 3

## 2019-12-25 MED ORDER — LIDOCAINE-EPINEPHRINE 1 %-1:100000 IJ SOLN
INTRAMUSCULAR | Status: AC
  Filled 2019-12-25: qty 1

## 2019-12-25 MED ORDER — LIDOCAINE HCL (PF) 2 % IJ SOLN
INTRAMUSCULAR | Status: AC
  Filled 2019-12-25: qty 5

## 2019-12-25 MED ORDER — MIDAZOLAM HCL 2 MG/2ML IJ SOLN
INTRAMUSCULAR | Status: DC | PRN
  Administered 2019-12-25: 2 mg via INTRAVENOUS

## 2019-12-25 MED ORDER — DEXMEDETOMIDINE (PRECEDEX) IN NS 20 MCG/5ML (4 MCG/ML) IV SYRINGE
PREFILLED_SYRINGE | INTRAVENOUS | Status: AC
  Filled 2019-12-25: qty 5

## 2019-12-25 MED ORDER — OXYCODONE HCL 5 MG/5ML PO SOLN
5.0000 mg | Freq: Once | ORAL | Status: DC | PRN
Start: 2019-12-25 — End: 2019-12-25

## 2019-12-25 MED ORDER — HYDROMORPHONE HCL 1 MG/ML IJ SOLN
0.2500 mg | INTRAMUSCULAR | Status: DC | PRN
  Administered 2019-12-25 (×4): 0.25 mg via INTRAVENOUS

## 2019-12-25 MED ORDER — OXYCODONE HCL 5 MG PO TABS: 5.0000 mg | ORAL_TABLET | Freq: Once | ORAL | Status: DC | PRN

## 2019-12-25 MED ORDER — OXYCODONE HCL 5 MG PO TABS: 5.0000 mg | ORAL_TABLET | ORAL | 0 refills | Status: DC | PRN

## 2019-12-25 MED ORDER — LACTATED RINGERS IV SOLN: INTRAVENOUS | Status: DC

## 2019-12-25 MED ORDER — BUPIVACAINE LIPOSOME 1.3 % IJ SUSP
INTRAMUSCULAR | Status: DC | PRN
  Administered 2019-12-25: 20 mL

## 2019-12-25 MED ORDER — FENTANYL CITRATE (PF) 100 MCG/2ML IJ SOLN
25.0000 ug | INTRAMUSCULAR | Status: DC | PRN
  Administered 2019-12-25 (×2): 50 ug via INTRAVENOUS

## 2019-12-25 MED ORDER — IPRATROPIUM-ALBUTEROL 0.5-2.5 (3) MG/3ML IN SOLN: 3.0000 mL | Freq: Once | RESPIRATORY_TRACT | Status: DC

## 2019-12-25 MED ORDER — ACETAMINOPHEN 500 MG PO TABS: 1000.0000 mg | ORAL_TABLET | Freq: Three times a day (TID) | ORAL | 2 refills | Status: DC

## 2019-12-25 MED ORDER — DEXTROSE 5 % IV SOLN
3.0000 g | INTRAVENOUS | Status: AC
  Administered 2019-12-25: 12:00:00 3 g via INTRAVENOUS
  Filled 2019-12-25: qty 3

## 2019-12-25 MED ORDER — FENTANYL CITRATE (PF) 100 MCG/2ML IJ SOLN
INTRAMUSCULAR | Status: AC
  Filled 2019-12-25: qty 2

## 2019-12-25 MED ORDER — HYDROMORPHONE HCL 1 MG/ML IJ SOLN
INTRAMUSCULAR | Status: AC
  Filled 2019-12-25: qty 1

## 2019-12-25 MED ORDER — HYDROMORPHONE HCL 1 MG/ML IJ SOLN
INTRAMUSCULAR | Status: DC | PRN
  Administered 2019-12-25 (×2): .5 mg via INTRAVENOUS

## 2019-12-25 MED ORDER — BUPIVACAINE HCL (PF) 0.5 % IJ SOLN
INTRAMUSCULAR | Status: AC
  Filled 2019-12-25: qty 30

## 2019-12-25 MED ORDER — ONDANSETRON HCL 4 MG/2ML IJ SOLN
INTRAMUSCULAR | Status: DC | PRN
  Administered 2019-12-25: 4 mg via INTRAVENOUS

## 2019-12-25 MED ORDER — MIDAZOLAM HCL 2 MG/2ML IJ SOLN
INTRAMUSCULAR | Status: AC
  Filled 2019-12-25: qty 2

## 2019-12-25 MED ORDER — FENTANYL CITRATE (PF) 100 MCG/2ML IJ SOLN
INTRAMUSCULAR | Status: DC | PRN
  Administered 2019-12-25 (×2): 50 ug via INTRAVENOUS
  Administered 2019-12-25: 25 ug via INTRAVENOUS
  Administered 2019-12-25: 50 ug via INTRAVENOUS
  Administered 2019-12-25: 25 ug via INTRAVENOUS

## 2019-12-25 MED ORDER — PROPOFOL 500 MG/50ML IV EMUL
INTRAVENOUS | Status: AC
  Filled 2019-12-25: qty 250

## 2019-12-25 MED ORDER — PROPOFOL 10 MG/ML IV BOLUS
INTRAVENOUS | Status: DC | PRN
  Administered 2019-12-25: 50 mg via INTRAVENOUS
  Administered 2019-12-25: 200 mg via INTRAVENOUS

## 2019-12-25 MED ORDER — ACETAMINOPHEN 10 MG/ML IV SOLN
INTRAVENOUS | Status: DC | PRN
  Administered 2019-12-25: 1000 mg via INTRAVENOUS

## 2019-12-25 MED ORDER — ROCURONIUM BROMIDE 100 MG/10ML IV SOLN
INTRAVENOUS | Status: DC | PRN
  Administered 2019-12-25 (×2): 10 mg via INTRAVENOUS
  Administered 2019-12-25: 50 mg via INTRAVENOUS
  Administered 2019-12-25: 10 mg via INTRAVENOUS
  Administered 2019-12-25: 50 mg via INTRAVENOUS

## 2019-12-25 MED ORDER — PROMETHAZINE HCL 25 MG/ML IJ SOLN
INTRAMUSCULAR | Status: AC
  Filled 2019-12-25: qty 1

## 2019-12-25 MED ORDER — ONDANSETRON HCL 4 MG/2ML IJ SOLN
INTRAMUSCULAR | Status: AC
  Filled 2019-12-25: qty 2

## 2019-12-25 MED ORDER — ONDANSETRON 4 MG PO TBDP: 4.0000 mg | ORAL_TABLET | Freq: Three times a day (TID) | ORAL | 0 refills | Status: DC | PRN

## 2019-12-25 MED ORDER — LIDOCAINE HCL (CARDIAC) PF 100 MG/5ML IV SOSY
PREFILLED_SYRINGE | INTRAVENOUS | Status: DC | PRN
  Administered 2019-12-25: 100 mg via INTRAVENOUS

## 2019-12-25 MED ORDER — BUPIVACAINE HCL 0.5 % IJ SOLN
INTRAMUSCULAR | Status: DC | PRN
  Administered 2019-12-25: 30 mL

## 2019-12-25 MED ORDER — NEOMYCIN-POLYMYXIN B GU 40-200000 IR SOLN
Status: AC
  Filled 2019-12-25: qty 4

## 2019-12-25 MED ORDER — DEXMEDETOMIDINE HCL 200 MCG/2ML IV SOLN
INTRAVENOUS | Status: DC | PRN
  Administered 2019-12-25: 8 ug via INTRAVENOUS
  Administered 2019-12-25 (×2): 4 ug via INTRAVENOUS
  Administered 2019-12-25: 8 ug via INTRAVENOUS
  Administered 2019-12-25 (×2): 4 ug via INTRAVENOUS

## 2019-12-25 MED ORDER — ORAL CARE MOUTH RINSE: 15.0000 mL | Freq: Once | OROMUCOSAL | Status: AC

## 2019-12-25 MED ORDER — DEXAMETHASONE SODIUM PHOSPHATE 10 MG/ML IJ SOLN
INTRAMUSCULAR | Status: DC | PRN
  Administered 2019-12-25: 10 mg via INTRAVENOUS

## 2019-12-25 MED ORDER — EPINEPHRINE PF 1 MG/ML IJ SOLN
INTRAMUSCULAR | Status: AC
  Filled 2019-12-25: qty 4

## 2019-12-25 MED ORDER — PROPOFOL 500 MG/50ML IV EMUL
INTRAVENOUS | Status: DC | PRN
  Administered 2019-12-25: 150 ug/kg/min via INTRAVENOUS

## 2019-12-25 MED ORDER — PROMETHAZINE HCL 25 MG/ML IJ SOLN
6.2500 mg | INTRAMUSCULAR | Status: DC | PRN
  Administered 2019-12-25: 17:00:00 6.25 mg via INTRAVENOUS

## 2019-12-25 MED ORDER — SUCCINYLCHOLINE CHLORIDE 20 MG/ML IJ SOLN
INTRAMUSCULAR | Status: DC | PRN
  Administered 2019-12-25: 200 mg via INTRAVENOUS

## 2019-12-25 MED ORDER — BUPIVACAINE LIPOSOME 1.3 % IJ SUSP
INTRAMUSCULAR | Status: AC
  Filled 2019-12-25: qty 20

## 2019-12-25 MED ORDER — NEOMYCIN-POLYMYXIN B GU 40-200000 IR SOLN
Status: DC | PRN
  Administered 2019-12-25: 4 mL

## 2019-12-25 MED ORDER — ROCURONIUM BROMIDE 10 MG/ML (PF) SYRINGE
PREFILLED_SYRINGE | INTRAVENOUS | Status: AC
  Filled 2019-12-25: qty 10

## 2019-12-25 MED ORDER — SUGAMMADEX SODIUM 500 MG/5ML IV SOLN
INTRAVENOUS | Status: AC
  Filled 2019-12-25: qty 5

## 2019-12-25 MED ORDER — GLYCOPYRROLATE 0.2 MG/ML IJ SOLN
INTRAMUSCULAR | Status: DC | PRN
  Administered 2019-12-25: .2 mg via INTRAVENOUS

## 2019-12-25 MED ORDER — EPHEDRINE 5 MG/ML INJ
INTRAVENOUS | Status: AC
  Filled 2019-12-25: qty 10

## 2019-12-25 MED ORDER — SUGAMMADEX SODIUM 200 MG/2ML IV SOLN
INTRAVENOUS | Status: DC | PRN
  Administered 2019-12-25: 300 mg via INTRAVENOUS

## 2019-12-25 MED ORDER — PROPOFOL 500 MG/50ML IV EMUL
INTRAVENOUS | Status: AC
  Filled 2019-12-25: qty 100

## 2019-12-25 MED ORDER — ACETAMINOPHEN 10 MG/ML IV SOLN
INTRAVENOUS | Status: AC
  Filled 2019-12-25: qty 100

## 2019-12-25 SURGICAL SUPPLY — 104 items
"PENCIL ELECTRO HAND CTR " (MISCELLANEOUS) ×1 IMPLANT
ADAPTER IRRIG TUBE 2 SPIKE SOL (ADAPTER) ×3 IMPLANT
ADPR TBG 2 SPK PMP STRL ASCP (ADAPTER) ×1
ALLOGRAFT PATELLA LEFT MOPS (Tissue) ×2 IMPLANT
ANCH SUT Q-FX 1.8 BLU (Anchor) ×3 IMPLANT
ANCH SUT Q-FX 2.8 (Anchor) ×1 IMPLANT
ANCHOR ALL-SUT Q-FIX 1.8 BLUE (Anchor) ×6 IMPLANT
ANCHOR ALL-SUT Q-FIX 2.8 (Anchor) ×2 IMPLANT
APL PRP STRL LF DISP 70% ISPRP (MISCELLANEOUS) ×1
BASIN GRAD PLASTIC 32OZ STRL (MISCELLANEOUS) ×3 IMPLANT
BLADE SAW SAG 29X58X.64 (BLADE) IMPLANT
BLADE SAW SGTL 13X75X1.27 (BLADE) ×2 IMPLANT
BLADE SURG SZ10 CARB STEEL (BLADE) ×3 IMPLANT
BLADE SURG SZ11 CARB STEEL (BLADE) ×3 IMPLANT
BNDG COHESIVE 6X5 TAN STRL LF (GAUZE/BANDAGES/DRESSINGS) ×2 IMPLANT
BNDG ESMARK 6X12 TAN STRL LF (GAUZE/BANDAGES/DRESSINGS) ×3 IMPLANT
BRACE KNEE POST OP SHORT (BRACE) ×1 IMPLANT
BUR 4X45 EGG (BURR) ×1 IMPLANT
BUR 4X55 1 (BURR) ×1 IMPLANT
BUR RADIUS 3.5 (BURR) ×1 IMPLANT
CHLORAPREP W/TINT 26 (MISCELLANEOUS) ×3 IMPLANT
COOLER POLAR GLACIER W/PUMP (MISCELLANEOUS) ×3 IMPLANT
COVER MAYO STAND STRL (DRAPES) ×3 IMPLANT
CUFF TOURN SGL QUICK 24 (TOURNIQUET CUFF) ×3
CUFF TOURN SGL QUICK 30 (TOURNIQUET CUFF) ×3
CUFF TRNQT CYL 24X4X16.5-23 (TOURNIQUET CUFF) ×1 IMPLANT
CUFF TRNQT CYL 30X4X21-28X (TOURNIQUET CUFF) ×1 IMPLANT
DRAPE C-ARM XRAY 36X54 (DRAPES) ×3 IMPLANT
DRAPE C-ARMOR (DRAPES) ×3 IMPLANT
DRAPE IMP U-DRAPE 54X76 (DRAPES) ×3 IMPLANT
DRAPE SHEET LG 3/4 BI-LAMINATE (DRAPES) ×3 IMPLANT
DRAPE SURG 17X11 SM STRL (DRAPES) ×3 IMPLANT
DRAPE TABLE BACK 80X90 (DRAPES) ×3 IMPLANT
DRSG OPSITE POSTOP 3X4 (GAUZE/BANDAGES/DRESSINGS) ×3 IMPLANT
ELECT REM PT RETURN 9FT ADLT (ELECTROSURGICAL) ×3
ELECTRODE REM PT RTRN 9FT ADLT (ELECTROSURGICAL) ×1 IMPLANT
GAUZE SPONGE 4X4 12PLY STRL (GAUZE/BANDAGES/DRESSINGS) ×3 IMPLANT
GLOVE BIOGEL PI IND STRL 8 (GLOVE) ×1 IMPLANT
GLOVE BIOGEL PI INDICATOR 8 (GLOVE) ×2
GLOVE SURG ORTHO 8.0 STRL STRW (GLOVE) ×3 IMPLANT
GOWN STRL REUS W/ TWL LRG LVL3 (GOWN DISPOSABLE) ×1 IMPLANT
GOWN STRL REUS W/ TWL XL LVL3 (GOWN DISPOSABLE) ×1 IMPLANT
GOWN STRL REUS W/TWL LRG LVL3 (GOWN DISPOSABLE) ×3
GOWN STRL REUS W/TWL XL LVL3 (GOWN DISPOSABLE) ×3
GRADUATE 1200CC STRL 31836 (MISCELLANEOUS) ×3 IMPLANT
GRAFT TISS SEMITEND 4-8 (Bone Implant) IMPLANT
HANDLE YANKAUER SUCT BULB TIP (MISCELLANEOUS) ×3 IMPLANT
IV LACTATED RINGER IRRG 3000ML (IV SOLUTION) ×24
IV LR IRRIG 3000ML ARTHROMATIC (IV SOLUTION) ×8 IMPLANT
IV NS 1000ML (IV SOLUTION) ×3
IV NS 1000ML BAXH (IV SOLUTION) IMPLANT
KIT SUTURE 1.8 Q-FIX DISP (KITS) ×2 IMPLANT
KIT SUTURE 2.8 Q-FIX DISP (MISCELLANEOUS) ×2 IMPLANT
KIT TURNOVER KIT A (KITS) ×3 IMPLANT
LABEL OR SOLS (LABEL) ×3 IMPLANT
MANIFOLD NEPTUNE II (INSTRUMENTS) ×6 IMPLANT
MAT ABSORB  FLUID 56X50 GRAY (MISCELLANEOUS) ×2
MAT ABSORB FLUID 56X50 GRAY (MISCELLANEOUS) ×1 IMPLANT
NDL FILTER BLUNT 18X1 1/2 (NEEDLE) ×1 IMPLANT
NDL HYPO 21X1.5 SAFETY (NEEDLE) ×1 IMPLANT
NDL MAYO 6 CRC TAPER PT (NEEDLE) IMPLANT
NDL SAFETY ECLIPSE 18X1.5 (NEEDLE) ×1 IMPLANT
NEEDLE FILTER BLUNT 18X 1/2SAF (NEEDLE) ×2
NEEDLE FILTER BLUNT 18X1 1/2 (NEEDLE) ×1 IMPLANT
NEEDLE HYPO 18GX1.5 SHARP (NEEDLE) ×3
NEEDLE HYPO 21X1.5 SAFETY (NEEDLE) ×3 IMPLANT
NEEDLE HYPO 22GX1.5 SAFETY (NEEDLE) ×3 IMPLANT
NEEDLE MAYO 6 CRC TAPER PT (NEEDLE) ×3 IMPLANT
NS IRRIG 1000ML POUR BTL (IV SOLUTION) ×3 IMPLANT
PACK ARTHROSCOPY KNEE (MISCELLANEOUS) ×3 IMPLANT
PAD ABD DERMACEA PRESS 5X9 (GAUZE/BANDAGES/DRESSINGS) ×3 IMPLANT
PAD CAST CTTN 4X4 STRL (SOFTGOODS) ×1 IMPLANT
PAD WRAPON POLAR KNEE (MISCELLANEOUS) ×1 IMPLANT
PADDING CAST 6X4YD NS (MISCELLANEOUS) ×2
PADDING CAST COTTON 4X4 STRL (SOFTGOODS) ×3
PADDING CAST COTTON 6X4 NS (MISCELLANEOUS) ×1 IMPLANT
PENCIL ELECTRO HAND CTR (MISCELLANEOUS) ×3 IMPLANT
PULSAVAC PLUS IRRIG FAN TIP (DISPOSABLE) ×3
RETRIEVER SUT HEWSON (MISCELLANEOUS) ×3 IMPLANT
SET TUBE SUCT SHAVER OUTFL 24K (TUBING) ×3 IMPLANT
SET TUBE TIP INTRA-ARTICULAR (MISCELLANEOUS) ×3 IMPLANT
SPONGE LAP 18X18 RF (DISPOSABLE) ×5 IMPLANT
STAPLER SKIN PROX 35W (STAPLE) ×3 IMPLANT
SUCTION FRAZIER HANDLE 10FR (MISCELLANEOUS) ×2
SUCTION TUBE FRAZIER 10FR DISP (MISCELLANEOUS) ×1 IMPLANT
SUT ETHILON 3-0 FS-10 30 BLK (SUTURE) ×6
SUT MNCRL AB 4-0 PS2 18 (SUTURE) ×4 IMPLANT
SUT VIC AB 0 CT1 36 (SUTURE) ×3 IMPLANT
SUT VIC AB 1 CT1 36 (SUTURE) ×4 IMPLANT
SUT VIC AB 2-0 CT1 27 (SUTURE)
SUT VIC AB 2-0 CT1 TAPERPNT 27 (SUTURE) ×2 IMPLANT
SUT VIC AB 2-0 CT2 27 (SUTURE) ×5 IMPLANT
SUTURE EHLN 3-0 FS-10 30 BLK (SUTURE) ×1 IMPLANT
SYR 10ML LL (SYRINGE) ×3 IMPLANT
SYR 30ML LL (SYRINGE) ×3 IMPLANT
SYR 50ML LL SCALE MARK (SYRINGE) ×3 IMPLANT
SYR BULB IRRIG 60ML STRL (SYRINGE) ×3 IMPLANT
TENDON SEMI-TENDINOSUS (Bone Implant) ×3 IMPLANT
TIP FAN IRRIG PULSAVAC PLUS (DISPOSABLE) IMPLANT
TOWEL OR 17X26 4PK STRL BLUE (TOWEL DISPOSABLE) ×5 IMPLANT
TUBING ARTHRO INFLOW-ONLY STRL (TUBING) ×3 IMPLANT
WAND HAND CNTRL MULTIVAC 50 (MISCELLANEOUS) ×1 IMPLANT
WAND WEREWOLF FLOW 90D (MISCELLANEOUS) ×3 IMPLANT
WRAPON POLAR PAD KNEE (MISCELLANEOUS) ×3

## 2019-12-25 NOTE — Discharge Instructions (Addendum)
Post-Op Instructions - Osteochondral Allograft to Patella  1. Bracing or crutches: You will be provided with a long brace (from hip to ankle) and crutches at the time of surgery.  2. Ice: You will be provided with a device [facility]) that allows you to ice the affected area effectively.   3. Showering: Incision must remain dry for 5 days. Afterwards, you may shower and gently pat incision dry.  NO submerging wound for 4 weeks.   4. Driving: You will be given specific driving precautions at discharge. Plan on not driving for at least two weeks for left knee surgery, and 4 weeks for right knee surgery if you are restricted due to the brace and knee motion. Please note that you are advised NOT to drive while taking narcotic pain medications as you may be impaired and unsafe to drive.  5. Activity: Weight bearing: WBAT with brace locked in extension. Brace must remain locked in extension at all times unless otherwise instructed. Bending the knee is limited and will be guided by the physical therapist. Elevate knee above heart level as much as possible for one week. Avoid standing more than 5 minutes (consecutively) for the first week. No exercise involving the knee until cleared by the surgeon or physical therapist.  Avoid long distance travel for 4 weeks.  6. Medications: - You have been provided a prescription for narcotic pain medicine. After surgery, take 1-2 narcotic tablets every 4 hours if needed for severe pain.  - A prescription for anti-nausea medication will be provided in case the narcotic medicine causes nausea - take 1 tablet every 6 hours only if nauseated.  - Take Lovenox 40mg  injection once daily for 4 weeks to prevent blood clots. Start on postoperative day #1 (day after surgery). -Take tylenol 1000 every 8 hours for pain.  May stop tylenol 3 days after surgery or when you are having minimal pain. -DO NOT TAKE IBUPROFEN, ALEVE or OTHER NSAIDs as they can interfere with bone healing.   -Take Citracal Maximum Strength (calcium citrate + vitamin D), 2 tabs daily.  If you are taking prescription medication for anxiety, depression, insomnia, muscle spasm, chronic pain, or for attention deficit disorder, you are advised that you are at a higher risk of adverse effects with use of narcotics post-op, including narcotic addiction/dependence, depressed breathing, death. If you use non-prescribed substances: alcohol, marijuana, cocaine, heroin, methamphetamines, etc., you are at a higher risk of adverse effects with use of narcotics post-op, including narcotic addiction/dependence, depressed breathing, death. You are advised that taking > 50 morphine milligram equivalents (MME) of narcotic pain medication per day results in twice the risk of overdose or death. For your prescription provided: oxycodone 5 mg - taking more than 6 tablets per day would result in > 50 morphine milligram equivalents (MME) of narcotic pain medication. Be advised that we will prescribe narcotics short-term, for acute post-operative pain, only 3 weeks for major operations such as knee repair/reconstruction surgeries.   7. Bandages: The physical therapist should change the bandages at the first post-op appointment. If needed, the dressing supplies have been provided to you.  8. Physical Therapy: 2 times per week for the first 4 weeks, then 1-2 times per week from weeks 4-8 post-op. Therapy typically starts on post operative Day 3 or 4. You have been provided an order for physical therapy today and should schedule your appointments in advance to avoid delay. The therapist will provide home exercises.  9. Work or School: For most, but  not all procedures, we advise staying out of work or school for at least 1 to 2 weeks in order to recover from the stress of surgery and to allow time for healing and swelling control. For more active jobs, you may need to remain out for longer. Please discuss with Dr. Allena Katz if you have not  already done so. If you need a work or school note this can be provided.   10. Post-Op Appointments: Your first post-op appointment will be with Dr. Allena Katz in approximately 2 weeks time. Please double check if this will be at the [facility] facility (Tuesdays and Thursdays) or [facility] facility (Wednesdays).    If you find that they have not been scheduled please call the Orthopaedic Appointment front desk at [phone].   Bupivacaine Liposomal Suspension for Injection What is this medicine? BUPIVACAINE LIPOSOMAL (bue PIV a kane LIP oh som al) is an anesthetic. It causes loss of feeling in the skin or other tissues. It is used to prevent and to treat pain from some procedures. This medicine may be used for other purposes; ask your health care provider or pharmacist if you have questions. COMMON BRAND NAME(S): EXPAREL What should I tell my health care provider before I take this medicine? They need to know if you have any of these conditions:  G6PD deficiency  heart disease  kidney disease  liver disease  low blood pressure  lung or breathing disease, like asthma  an unusual or allergic reaction to bupivacaine, other medicines, foods, dyes, or preservatives  pregnant or trying to get pregnant  breast-feeding How should I use this medicine? This medicine is for injection into the affected area. It is given by a health care professional in a hospital or clinic setting. Talk to your pediatrician regarding the use of this medicine in children. Special care may be needed. Overdosage: If you think you have taken too much of this medicine contact a poison control center or emergency room at once. NOTE: This medicine is only for you. Do not share this medicine with others. What if I miss a dose? This does not apply. What may interact with this medicine? This medicine may interact with the following medications:  acetaminophen  certain antibiotics like dapsone, nitrofurantoin,  aminosalicylic acid, sulfonamides  certain medicines for seizures like phenobarbital, phenytoin, valproic acid  chloroquine  cyclophosphamide  flutamide  hydroxyurea  ifosfamide  metoclopramide  nitric oxide  nitroglycerin  nitroprusside  nitrous oxide  other local anesthetics like lidocaine, pramoxine, tetracaine  primaquine  quinine  rasburicase  sulfasalazine This list may not describe all possible interactions. Give your health care provider a list of all the medicines, herbs, non-prescription drugs, or dietary supplements you use. Also tell them if you smoke, drink alcohol, or use illegal drugs. Some items may interact with your medicine. What should I watch for while using this medicine? Your condition will be monitored carefully while you are receiving this medicine. Be careful to avoid injury while the area is numb, and you are not aware of pain. What side effects may I notice from receiving this medicine? Side effects that you should report to your doctor or health care professional as soon as possible:  allergic reactions like skin rash, itching or hives, swelling of the face, lips, or tongue  seizures  signs and symptoms of a dangerous change in heartbeat or heart rhythm like chest pain; dizziness; fast, irregular heartbeat; palpitations; feeling faint or lightheaded; falls; breathing problems  signs and symptoms of methemoglobinemia  such as pale, gray, or blue colored skin; headache; fast heartbeat; shortness of breath; feeling faint or lightheaded, falls; tiredness Side effects that usually do not require medical attention (report to your doctor or health care professional if they continue or are bothersome):  anxious  back pain  changes in taste  changes in vision  constipation  dizziness  fever  nausea, vomiting This list may not describe all possible side effects. Call your doctor for medical advice about side effects. You may report  side effects to FDA at 1-800-FDA-1088. Where should I keep my medicine? This drug is given in a hospital or clinic and will not be stored at home. NOTE: This sheet is a summary. It may not cover all possible information. If you have questions about this medicine, talk to your doctor, pharmacist, or health care provider.  2020 Elsevier/Gold Standard (2018-10-11 10:48:23)  AMBULATORY SURGERY  DISCHARGE INSTRUCTIONS   1) The drugs that you were given will stay in your system until tomorrow so for the next 24 hours you should not:  A) Drive an automobile B) Make any legal decisions C) Drink any alcoholic beverage   2) You may resume regular meals tomorrow.  Today it is better to start with liquids and gradually work up to solid foods.  You may eat anything you prefer, but it is better to start with liquids, then soup and crackers, and gradually work up to solid foods.   3) Please notify your doctor immediately if you have any unusual bleeding, trouble breathing, redness and pain at the surgery site, drainage, fever, or pain not relieved by medication.    4) Additional Instructions:        Please contact your physician with any problems or Same Day Surgery at [phone], Monday through Friday 6 am to 4 pm, or [place] at [facility] number at [phone].

## 2019-12-25 NOTE — Anesthesia Preprocedure Evaluation (Addendum)
Anesthesia Evaluation  Patient identified by MRN, date of birth, ID band Patient awake    Reviewed: Allergy & Precautions, H&P , NPO status , Patient's Chart, lab work & pertinent test results  History of Anesthesia Complications (+) PONV, Family history of anesthesia reaction and history of anesthetic complications  Airway Mallampati: III  TM Distance: >3 FB Neck ROM: full    Dental  (+) Chipped, Poor Dentition, Missing   Pulmonary neg shortness of breath, asthma , pneumonia, former smoker,    Pulmonary exam normal        Cardiovascular Exercise Tolerance: Good (-) angina(-) Past MI and (-) DOE Normal cardiovascular exam     Neuro/Psych  Headaches,  Neuromuscular disease negative psych ROS   GI/Hepatic Neg liver ROS, GERD  Medicated and Controlled,  Endo/Other  negative endocrine ROS  Renal/GU      Musculoskeletal  (+) Arthritis ,   Abdominal   Peds  Hematology negative hematology ROS (+)   Anesthesia Other Findings Chronic pain in legs and back  Past Medical History: No date: Anemia No date: Arthritis     Comment:  left knee and hips No date: Asthma 1992: Chronic bronchitis (HCC) No date: Complication of anesthesia     Comment:  severe nausea and vomiting No date: Dyspnea No date: Family history of adverse reaction to anesthesia     Comment:  severe nausea and vomiting No date: GERD (gastroesophageal reflux disease) 02/17/2003: Guillain-Barre disease (HCC)     Comment:  B leg weakness No date: Headache     Comment:  migraines 1-2 per month No date: Insomnia No date: Orthostatic hypotension No date: PONV (postoperative nausea and vomiting)     Comment:  pain pills given after surgery makes her sick, she               voices "all" pain pills makes her sick 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 No date: RLS (restless legs syndrome)  Past Surgical History: 2009: CHOLECYSTECTOMY No date:  COLONOSCOPY No date: FINGER FRACTURE SURGERY 06/12/2014: GANGLION CYST EXCISION; Left     Comment:  Procedure: REMOVAL GANGLION OF WRIST;  Surgeon: Kennedy Bucker, MD;  Location: [facility];  Service: Orthopedics;                Laterality: Left; 2005: KNEE ARTHROSCOPY; Left No date: left knee arthrscopy 08/17/2014: TONSILLECTOMY AND ADENOIDECTOMY; Bilateral     Comment:  Procedure: TONSILLECTOMY AND ADENOIDECTOMY;  Surgeon:               Linus Salmons, MD;  Location: [facility];                Service: ENT;  Laterality: Bilateral; 2010: TUBAL LIGATION; Bilateral  BMI    Body Mass Index: 46.90 kg/m      Reproductive/Obstetrics negative OB ROS                            Anesthesia Physical Anesthesia Plan  ASA: III  Anesthesia Plan: General ETT   Post-op Pain Management: GA combined w/ Regional for post-op pain   Induction: Intravenous  PONV Risk Score and Plan: Ondansetron, Dexamethasone, Midazolam, Treatment may vary due to age or medical condition, Propofol infusion and TIVA  Airway Management Planned: Oral ETT  Additional Equipment:   Intra-op Plan:   Post-operative Plan: Extubation in OR  Informed Consent:  I have reviewed the patients History and Physical, chart, labs and discussed the procedure including the risks, benefits and alternatives for the proposed anesthesia with the patient or authorized representative who has indicated his/her understanding and acceptance.     Dental Advisory Given  Plan Discussed with: Anesthesiologist, CRNA and Surgeon  Anesthesia Plan Comments: (Patient consented for post op PNB PRN  Patient consented for risks of anesthesia including but not limited to:  - adverse reactions to medications - damage to eyes, teeth, lips or other oral mucosa - nerve damage due to positioning  - sore throat or hoarseness - Damage to heart, brain, nerves, lungs, other parts of body or loss of  life  Patient voiced understanding.)      Anesthesia Quick Evaluation

## 2019-12-25 NOTE — Op Note (Addendum)
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 Left knee lateral meniscus tear    PROCEDURES:  1. Left patella osteochondral allograft 2. Left medial patellofemoral ligament reconstruction using semitendinosus allograft 3. Left knee arthroscopy, partial lateral meniscectomy  SURGEON:Neila Teem Molinda BailiffH. Katara Griner, MD  ASSISTANT(S): J.Todd Lenard ForthMundy, [place]  ANESTHESIA:Gen  TOTAL IV FLUIDS: see anesthesia record  ESTIMATED BLOOD LOSS: 100cc  TOURNIQUET TIME: 130 min  SPECIMENS: None.  IMPLANTS:  - MTF Patella osteochondral allograft, 25mm x1 - Lepp[employer]hew Qfix mini anchors x2, Qfix anchor x 1 - Semitendinosus allograft - 7mm    COMPLICATIONS: None apparent.  INDICATIONS: Lisa Sharp is a 39 y.o. female with recurrent patellar dislocations. Subsequent clinical picture, radiographs, and MRI were concerning for full-thickness chondral defect of the patella with underlying reactive bone marrow edema, lateral patellar tilt, and lateral meniscus tear without coronal or patellofemoral malalignment. Given the patient's activity level and full-thickness large chondral defect, surgery was recommended for MPFL reconstruction to reduce recurrent patellar dislocation risk, osteochondral allograft for patellofemoral chondral pathology, and lateral meniscus repair versus partial meniscectomy to address lateral meniscus tear.  After discussion of risks, benefits, and alternatives to surgery, the patient elected to proceed.     OPERATIVE FINDINGS:    Examination under anesthesia: A careful examination under anesthesia was performed.  Passive range of motion was: Hyperextension: 10.  Extension: 0.  Flexion: 125.  Lachman: normal. Pivot Shift: normal.  Posterior drawer: normal.  Varus stability in full  extension: normal.  Varus stability in 30 degrees of flexion: normal.  Valgus stability in full extension: normal.  Valgus stability in 30 degrees of flexion: normal.  4 quadrants lateral patellar mobility with ability to essentially dislocate patella.   Intra-operative findings: A thorough arthroscopic examination of the knee was performed.  The findings are: 1. Suprapatellar pouch: Normal 2. Undersurface of median ridge: Grade 4 degenerative changes 3. Medial patellar facet: Grade 4 degenerative changes 4. Lateral patellar facet: Extension of grade 4 degenerative changes from median ridge with smaller area of Grade 2 degenerative changes 5. Trochlea: Grade 1 degenerative changes 6. Lateral gutter/popliteus tendon: Normal 7. Hoffa's fat pad: Inflamed 8. Medial gutter/plica: Normal 9. ACL: Normal 10. PCL: Normal 11. Medial meniscus: normal 12. Medial compartment cartilage: Grade 2 degenerative changes to the tibial plateau, normal femoral condyle 13. Lateral meniscus: Complex degenerative tearing of the meniscus body and anterior horn.  Anterior horn tear was a horizontal/oblique tear with a radial/parrot-beak component extending to the meniscus body.  This extension involve the entire width of the meniscus. 14. Lateral compartment cartilage: Grade 2-3 degenerative changes to the tibial plateau and lateral femoral condyle  DETAILS OF PROCEDURE: The patient was identified in the preoperative holding area and the operative leg was marked.  The patient was brought to the operating room.  The patient was placed supine on the OR table.  General anesthesia was administered.  A bump was placed under the hip and a tourniquet was placed on the thigh. The operative extremity was prescrubbed with Hibiclens and alcohol, prepped with ChloraPrep, and draped in the usual sterile fashion. The patient was given preoperative IV antibiotics within 30 minutes of the start of the case and a surgical time-out  confirming patient identity, procedure, and laterality was performed.    Arthroscopy portals  were marked.  The anterolateral portal was established with an 11 blade.  The arthroscope was placed in the anterolateral portal and then into the suprapatellar pouch. Next, the medial portal was established under needle localization. A diagnostic knee scope was completed with the above findings. The chondral pathology of the patella was confirmed.  The lateral meniscus tear was identified.   The meniscal tear was not repairable given the degeneration of the tear.  Therefore, was debrided using an arthroscopic biter and an oscillating shaver until the meniscus had stable borders.  This required resection of the entire width of the meniscus at the meniscus body and the superior leaflet of the anterior horn.  A chondroplasty was performed of the lateral compartment such that there were stable cartilage edges without any loose fragments of cartilage. Arthroscopic fluid was removed from the joint.  A longitudinal incision was marked from just proximal patella to the tibial tubercle. The leg was elevated and exsanguinated with an Esmarch bandage and tourniquet was inflated.  A 10 blade was used to make this midline incision.  Medial and lateral flaps were developed. The interval between layer 2 and layer 3 just medial to the patella was identified and tagged for later MPFL allograft passage.   Next, anchors for MPFL reconstruction were placed.  A blunt kelly clamp was then passed between the tagged layers 2 and 3 posteriorly towards the femoral origin of the MPFL, confirming appropriate layer. Using bovie electrocautery, dissection onto the medial surface of the patella was performed. A rongeur was used to create a trough at the midportion of the patella. Using fluoroscopy, two Q-fix mini all suture anchors were placed into the trough created on the patella. The first was placed at the midportion of the patella in the  superior/inferior aspect, and the second was placed ~2cm superior near the angle of the patella. Next, Schottle's point was localized with fluoroscopy and an incision was made over it. Dissection was carried down with bovie electrocautery and fascia was incised longitudinally. A drill guide for a Q fix double loaded anchor was placed over Schottle's point.  This was confirmed fluoroscopically.  Then, aiming approximately 20 degrees proximal and anterior, the Q fix anchor was placed.    Next, a medial parapatellar arthrotomy was performed from just proximal to the patella through the quadriceps tendon along the patella down to the insertion of the patellar tendon on the tubercle.  The patella was then everted.  The patella was inspected and noted to have a grade 4 lesion on the central patella from the medial facet, including the median ridge, and with extension to the lateral facet. There were no focal lesions of the trochlea that required treatment.   Next, an MTF Lesion Gauge with a width of 25 mm best address the patellar defect. A guidepin was placed through the center of the gauge. The appropriately sized Lesion Reamer was then placed over the guide pin and reamed through sclerotic cancellous bone until the depths of the lesion bled. The appropriately sized dilator was then placed. A depth gauge was used to measure at 12:00, 3 o'clock, 6 o'clock, and 9 o'clock as well as 1:30, 4:30, 7:30, and 10:30 positions.   The osteoarticular allograft distal femur was then placed in the MTF Graft Station, and the area of the donor graft was matched to the contour of the patient's patella. An appropriately sized GraftMaker was placed over the osteoarticular allograft and reamed down through the graft. The depth measurements were  transferred to the graft. The graft was placed into the Graft Forceps, lining up the depth markings with the underside of the forceps jaws. An oscillating saw used to remove additional bone to  match the depths of the walls of the defect. A rongeur was used to fine-tune the depths of the osteochondral allograft plug. The plug was then pulse-lavaged. The graft was then carefully press fit and tamped flush with surrounding cartilage.  This achieved excellent and stable fit.  The knee was taken through a range of motion and the graft remained in place without any notable impingement.   Next, the MPFL reconstruction fixation was performed.  The knee was placed in ~30 degrees knee flexion. The semitendinosus allograft was marked at the midportion.  One limb of each of the 2 sutures was placed above the graft and the other limb was placed below the graft.  A circumferential, loop stitch was tied on either side of the central mark on the allograft.  This appropriately brought the graft to the anchor at Schottle's point.  A Kelly clamp was passed in between the tagged layers 2 and 3 to retreive both free ends of the graft. The graft limbs were held at a resting tension. One limb of the superior patella anchor was passed in a non-locking Krakow fashion through one free end of the graft and the second limb was passed through the graft in a simple fashion. This limb became the post and was used to shuttle the graft down to the anchor. This was then tied with alternating half-hitches. Similarly, this was performed for the more inferior suture anchor and the other free end of the graft. This achieved appropriate constraint of the patella. Patella could not be dislocated laterally.   Tourniquet was released.  There was no unusual bleeding.  The distal aspect of the wound was thoroughly irrigated.  The medial retinacular tissue about the reconstructed ligament was closed in a pants over vest fashion to allow for reefing of this tissue.  The parapatellar arthrotomy was closed with figure-of-eight stitches of #1 Vicryl.  Exparel mixed with ropivacaine was injected about the midline incision as well as the medial  incision for the MPFL reconstruction.  Both the medial distal femur incision and the midline incision were then closed with interrupted, inverted, 2-0 Vicryl sutures in the subdermal layer and then 4-0 Monocryl in a running, subcuticular fashion. Dermabond was applied.  3-0 nylon sutures were used to close the portals. Sterile dressings were then applied. Polar Care and hinged knee brace locked at 0 degrees was applied.  The patient was brought to PACU in stable condition.   Instrument, sponge, and needle counts were correct prior to wound closure and at the conclusion of the case.    Of note, assistance from a PA was essential to performing the surgery.  PA was present for the entire surgery.  PA assisted with patient positioning, retraction, instrumentation, and wound closure. The surgery would have been more difficult and had longer operative time without PA assistance.          DISPOSITION: PACU - hemodynamically stable.  POSTOPERATIVE PLAN: The patient will be discharged home from the PACU. The patient will be weightbearing as tolerated with the brace locked in full extension.  Given the patient's aspirin allergy, we have preoperatively discussed that she would take Lovenox injections for DVT prophylaxis.  Physical therapy to begin post-op day 3-4 for knee range of motion, patellar mobilization and scar mobilization.  Use patella  osteochondral allograft rehab protocol.  Return to clinic 10-14 days as scheduled.

## 2019-12-25 NOTE — Transfer of Care (Signed)
Immediate Anesthesia Transfer of Care Note  Patient: Lisa Sharp  Procedure(s) Performed: Left knee arthroscopy, partial lateral meniscectomy vs lateral meniscus repair, MPFL reconstruction using semitendinosus allograft, and osteochondral allograft to the patella, possible lateral release - Dedra Skeens to Assist (Left Knee)  Patient Location: PACU  Anesthesia Type:General  Level of Consciousness: awake, alert  and oriented  Airway & Oxygen Therapy: Patient Spontanous Breathing and Patient connected to face mask oxygen  Post-op Assessment: Report given to RN and Post -op Vital signs reviewed and stable  Post vital signs: Reviewed and stable  Last Vitals:  Vitals Value Taken Time  BP 114/88 12/25/19 1614  Temp    Pulse 114 12/25/19 1617  Resp 15 12/25/19 1617  SpO2 96 % 12/25/19 1617  Vitals shown include unvalidated device data.  Last Pain:  Vitals:   12/25/19 1010  TempSrc: Tympanic  PainSc: 6       Patients Stated Pain Goal: 4 (12/25/19 1010)  Complications: No complications documented.

## 2019-12-25 NOTE — Anesthesia Procedure Notes (Signed)
Procedure Name: Intubation Date/Time: 12/25/2019 12:07 PM Performed by: Hezzie Bump, CRNA Pre-anesthesia Checklist: Patient identified, Patient being monitored, Timeout performed, Emergency Drugs available and Suction available Patient Re-evaluated:Patient Re-evaluated prior to induction Oxygen Delivery Method: Circle system utilized Preoxygenation: Pre-oxygenation with 100% oxygen Induction Type: IV induction Ventilation: Mask ventilation without difficulty Laryngoscope Size: 3 and McGraph Grade View: Grade I Tube type: Oral Tube size: 7.0 mm Number of attempts: 1 Airway Equipment and Method: Stylet and Video-laryngoscopy Placement Confirmation: ETT inserted through vocal cords under direct vision,  positive ETCO2 and breath sounds checked- equal and bilateral Secured at: 21 cm Tube secured with: Tape Dental Injury: Teeth and Oropharynx as per pre-operative assessment

## 2019-12-25 NOTE — Progress Notes (Signed)
Pateint falling asleep, sats drooping, has to be reminded to take deep breathes, states knee is still a 10"killing me"

## 2019-12-25 NOTE — H&P (Signed)
Paper H&P to be scanned into permanent record. H&P reviewed. No significant changes noted.  

## 2019-12-26 ENCOUNTER — Encounter: Payer: Self-pay | Admitting: Orthopedic Surgery

## 2019-12-27 NOTE — Anesthesia Postprocedure Evaluation (Signed)
Anesthesia Post Note  Patient: VARONICA SIHARATH  Procedure(s) Performed: Left knee arthroscopy, partial lateral meniscectomy vs lateral meniscus repair, MPFL reconstruction using semitendinosus allograft, and osteochondral allograft to the patella, possible lateral release - Dedra Skeens to Assist (Left Knee)  Patient location during evaluation: PACU Anesthesia Type: General Level of consciousness: awake and alert and oriented Pain management: pain level controlled Vital Signs Assessment: post-procedure vital signs reviewed and stable Respiratory status: spontaneous breathing Cardiovascular status: blood pressure returned to baseline Anesthetic complications: no   No complications documented.   Last Vitals:  Vitals:   12/25/19 1750 12/25/19 1823  BP: (!) 129/96 140/70  Pulse: 97 80  Resp: 14 (!) 22  Temp: (!) 36.1 C   SpO2: 94% 94%    Last Pain:  Vitals:   12/25/19 1750  TempSrc: Temporal  PainSc: 3                  Bralynn Velador

## 2020-11-06 ENCOUNTER — Ambulatory Visit: Payer: Medicaid Other | Admitting: Advanced Practice Midwife

## 2020-11-12 ENCOUNTER — Ambulatory Visit (INDEPENDENT_AMBULATORY_CARE_PROVIDER_SITE_OTHER): Payer: Medicaid Other | Admitting: Obstetrics and Gynecology

## 2020-11-12 ENCOUNTER — Other Ambulatory Visit: Payer: Self-pay

## 2020-11-12 ENCOUNTER — Emergency Department: Payer: Medicaid Other

## 2020-11-12 ENCOUNTER — Encounter: Payer: Self-pay | Admitting: Obstetrics and Gynecology

## 2020-11-12 VITALS — BP 130/90 | Ht 67.0 in | Wt 290.0 lb

## 2020-11-12 DIAGNOSIS — J45909 Unspecified asthma, uncomplicated: Secondary | ICD-10-CM | POA: Diagnosis not present

## 2020-11-12 DIAGNOSIS — N83209 Unspecified ovarian cyst, unspecified side: Secondary | ICD-10-CM

## 2020-11-12 DIAGNOSIS — K760 Fatty (change of) liver, not elsewhere classified: Secondary | ICD-10-CM | POA: Insufficient documentation

## 2020-11-12 DIAGNOSIS — K5730 Diverticulosis of large intestine without perforation or abscess without bleeding: Secondary | ICD-10-CM | POA: Insufficient documentation

## 2020-11-12 DIAGNOSIS — Z87891 Personal history of nicotine dependence: Secondary | ICD-10-CM | POA: Insufficient documentation

## 2020-11-12 DIAGNOSIS — R1031 Right lower quadrant pain: Secondary | ICD-10-CM

## 2020-11-12 DIAGNOSIS — N83201 Unspecified ovarian cyst, right side: Secondary | ICD-10-CM | POA: Diagnosis not present

## 2020-11-12 DIAGNOSIS — Z20822 Contact with and (suspected) exposure to covid-19: Secondary | ICD-10-CM | POA: Diagnosis not present

## 2020-11-12 DIAGNOSIS — R52 Pain, unspecified: Secondary | ICD-10-CM

## 2020-11-12 LAB — URINALYSIS, COMPLETE (UACMP) WITH MICROSCOPIC
Bilirubin Urine: NEGATIVE
Glucose, UA: NEGATIVE mg/dL
Ketones, ur: 5 mg/dL — AB
Leukocytes,Ua: NEGATIVE
Nitrite: NEGATIVE
Protein, ur: NEGATIVE mg/dL
Specific Gravity, Urine: 1.018 (ref 1.005–1.030)
pH: 5 (ref 5.0–8.0)

## 2020-11-12 LAB — COMPREHENSIVE METABOLIC PANEL
ALT: 56 U/L — ABNORMAL HIGH (ref 0–44)
AST: 27 U/L (ref 15–41)
Albumin: 3.8 g/dL (ref 3.5–5.0)
Alkaline Phosphatase: 83 U/L (ref 38–126)
Anion gap: 6 (ref 5–15)
BUN: 12 mg/dL (ref 6–20)
CO2: 27 mmol/L (ref 22–32)
Calcium: 9.3 mg/dL (ref 8.9–10.3)
Chloride: 104 mmol/L (ref 98–111)
Creatinine, Ser: 0.64 mg/dL (ref 0.44–1.00)
GFR, Estimated: >60 mL/min (ref >60)
Glucose, Bld: 99 mg/dL (ref 70–99)
Potassium: 4 mmol/L (ref 3.5–5.1)
Sodium: 137 mmol/L (ref 135–145)
Total Bilirubin: 1 mg/dL (ref 0.3–1.2)
Total Protein: 6.8 g/dL (ref 6.5–8.1)

## 2020-11-12 LAB — POC URINE PREG, ED: Preg Test, Ur: NEGATIVE

## 2020-11-12 LAB — CBC
HCT: 39.3 % (ref 36.0–46.0)
Hemoglobin: 13.7 g/dL (ref 12.0–15.0)
MCH: 31.1 pg (ref 26.0–34.0)
MCHC: 34.9 g/dL (ref 30.0–36.0)
MCV: 89.1 fL (ref 80.0–100.0)
Platelets: 291 10*3/uL (ref 150–400)
RBC: 4.41 MIL/uL (ref 3.87–5.11)
RDW: 12.8 % (ref 11.5–15.5)
WBC: 7.1 10*3/uL (ref 4.0–10.5)
nRBC: 0 % (ref 0.0–0.2)

## 2020-11-12 NOTE — ED Triage Notes (Signed)
Pt in with co right sided abd pain since last week. STates was seen at [facility] and diagnosed with ovarian cyst. Pt sent here by GYN for persistent pain and further work up.

## 2020-11-12 NOTE — Progress Notes (Signed)
[facility], [facility]   Chief Complaint  Patient presents with   Pelvic Pain    Right side only started last tues    HPI:      [name] is a 40 y.o. [id number] whose LMP was Patient's last menstrual period was 10/21/2020 (exact date)., presents today for severe RLQ pain for the past wk. Sx started as sharp, crampy, stabbing and intermittent, more around umbilicus/RLQ  but has moved to more RLQ and suprapubic now.  Pain worsened and pt started with vomiting, went to [facility] ED 11/09/20. 3.7 cm right ovarian hemorrhagic cyst on u/s, "no evidence of ovarian torsion at the time of this examination". Had neg UPT and normal CBC. No abd/pelvis CT done. Given pain meds and zofran and told to f/u with GYN. Pt still in a lot of pain, not improving. Has nausea today but vomiting up until yesterday. Taking zofran and toradol regularly. No fevers. No diarrhea/constipation, no urin sx, no vag sx. Had vaginal spotting this AM, but menses not due for another 5 days. Hx of ruptured and hemorrhagic cysts in past, usually causing severe pain for pt but they resolve after a few days. Sx not improving for pt, instead worsening. FH endometriosis in her mom as well as leio on mat side.   She is sex active, no new partners. S/p TL. Menses are monthly, lasting 8-9 days, heavy for 5-6 days, changing super plus and pad every 2-3 hrs, with quarter to 1/2 dollar sized clots (normal CBC 10/22 at ED), mod to severe dysmen, improved with NSAIDs. Flow/pain getting worse over the yrs. Also gets menstrual migraine monthly, improved to fioricet. FH of leio. Pt did depo as a teenager that improved menses, migraines and dysmen. Also did OCPs, no side effects.   ED NOTES/LABS/IMAGING REVIEWED  Past Medical History:  Diagnosis Date   Anemia    Arthritis    left knee and hips   Asthma    Chronic bronchitis (HCC) 1992   Complication of anesthesia    severe nausea and vomiting   Dyspnea    Family history of  adverse reaction to anesthesia    severe nausea and vomiting   GERD (gastroesophageal reflux disease)    Guillain-Barre disease (HCC) 02/17/2003   B leg weakness   Headache    migraines 1-2 per month   Insomnia    Orthostatic hypotension    PONV (postoperative nausea and vomiting)    pain pills given after surgery makes her sick, she voices "all" pain pills makes her sick   Pseudopapilledema of both optic discs 1995   RLS (restless legs syndrome)     Past Surgical History:  Procedure Laterality Date   CHOLECYSTECTOMY  2009   COLONOSCOPY     FINGER FRACTURE SURGERY     GANGLION CYST EXCISION Left 06/12/2014   Procedure: REMOVAL GANGLION OF WRIST;  Surgeon: Kennedy Bucker, MD;  Location: [facility];  Service: Orthopedics;  Laterality: Left;   KNEE ARTHROSCOPY Left 2005   KNEE ARTHROSCOPY WITH PATELLA RECONSTRUCTION Left 12/25/2019   Procedure: Left knee arthroscopy, partial lateral meniscectomy vs lateral meniscus repair, MPFL reconstruction using semitendinosus allograft, and osteochondral allograft to the patella, possible lateral release - Dedra Skeens to Assist;  Surgeon: Signa Kell, MD;  Location: [facility];  Service: Orthopedics;  Laterality: Left;   left knee arthrscopy     TONSILLECTOMY AND ADENOIDECTOMY Bilateral 08/17/2014   Procedure: TONSILLECTOMY AND ADENOIDECTOMY;  Surgeon: Linus Salmons, MD;  Location: [place];  Service: ENT;  Laterality: Bilateral;   TUBAL LIGATION Bilateral 2010    Family History  Problem Relation Age of Onset   Fibroids Mother    Fibroids Maternal Grandmother    Uterine cancer Maternal Grandmother    Vaginal cancer Paternal Grandmother     Social History   Socioeconomic History   Marital status: Divorced    Spouse name: Not on file   Number of children: 2   Years of education: Not on file   Highest education level: Not on file  Occupational History   Not on file  Tobacco Use   Smoking status: Former    Packs/day: 1.00    Years:  5.00    Pack years: 5.00    Types: Cigarettes   Smokeless tobacco: Never  Vaping Use   Vaping Use: Never used  Substance and Sexual Activity   Alcohol use: Not Currently    Alcohol/week: 1.0 standard drink    Types: 1 Glasses of wine per week   Drug use: No   Sexual activity: Yes    Birth control/protection: Surgical    Comment: BTL  Other Topics Concern   Not on file  Social History Narrative   Dad and brother lives with patient   Social Determinants of Health   Financial Resource Strain: Not on file  Food Insecurity: Not on file  Transportation Needs: Not on file  Physical Activity: Not on file  Stress: Not on file  Social Connections: Not on file  Intimate Partner Violence: Not on file    Outpatient Medications Prior to Visit  Medication Sig Dispense Refill   acetaminophen (TYLENOL) 500 MG tablet Take 2 tablets (1,000 mg total) by mouth every 8 (eight) hours. 90 tablet 2   albuterol (PROVENTIL HFA;VENTOLIN HFA) 108 (90 Base) MCG/ACT inhaler Inhale 2 puffs into the lungs every 6 (six) hours as needed for wheezing or shortness of breath. 1 Inhaler 0   albuterol (PROVENTIL) (2.5 MG/3ML) 0.083% nebulizer solution Take 3 mLs (2.5 mg total) by nebulization every 4 (four) hours as needed for wheezing or shortness of breath. 75 mL 0   benzonatate (TESSALON) 100 MG capsule Take 100 mg by mouth 3 (three) times daily as needed for cough.      butalbital-acetaminophen-caffeine (FIORICET) 50-325-40 MG tablet Take 1 tablet by mouth every 6 (six) hours as needed for migraine.      cetirizine (ZYRTEC) 10 MG tablet Take 10 mg by mouth daily.     famotidine (PEPCID) 20 MG tablet Take 20 mg by mouth 2 (two) times daily.     ferrous sulfate 325 (65 FE) MG tablet Take 325 mg by mouth daily with breakfast.     fluticasone (FLONASE) 50 MCG/ACT nasal spray Place 1 spray into both nostrils daily.     Fluticasone-Salmeterol (ADVAIR) 250-50 MCG/DOSE AEPB Inhale 1 puff into the lungs 2 (two) times  daily.     ipratropium (ATROVENT) 0.02 % nebulizer solution Take 0.5 mg by nebulization every 6 (six) hours as needed for wheezing or shortness of breath.     montelukast (SINGULAIR) 10 MG tablet Take 10 mg by mouth at bedtime.     ondansetron (ZOFRAN ODT) 4 MG disintegrating tablet Take 1 tablet (4 mg total) by mouth every 8 (eight) hours as needed for nausea or vomiting. 20 tablet 0   oxybutynin (DITROPAN) 5 MG tablet Take 5 mg by mouth in the morning and at bedtime.     enoxaparin (LOVENOX) 40  MG/0.4ML injection Inject 0.4 mLs (40 mg total) into the skin daily for 28 doses. 11.2 mL 0   oxyCODONE (ROXICODONE) 5 MG immediate release tablet Take 1-2 tablets (5-10 mg total) by mouth every 4 (four) hours as needed (pain). 30 tablet 0   No facility-administered medications prior to visit.      ROS:  Review of Systems  Constitutional:  Negative for fever.  Gastrointestinal:  Positive for nausea and vomiting. Negative for blood in stool, constipation and diarrhea.  Genitourinary:  Positive for pelvic pain. Negative for dyspareunia, dysuria, flank pain, frequency, hematuria, urgency, vaginal bleeding, vaginal discharge and vaginal pain.  Musculoskeletal:  Negative for back pain.  Skin:  Negative for rash.  BREAST: No symptoms   OBJECTIVE:   Vitals:  BP 130/90   Ht 5\' 7"  (1.702 m)   Wt 290 lb (131.5 kg)   LMP 10/21/2020 (Exact Date)   BMI 45.42 kg/m   Physical Exam Vitals reviewed.  Constitutional:      Appearance: She is well-developed. She is not toxic-appearing or diaphoretic.     Comments: PT IS UNCOMFORTABLE, TEARFUL WITH HPI; HOLDING RLQ  Pulmonary:     Effort: Pulmonary effort is normal.  Abdominal:     Palpations: Abdomen is soft.     Tenderness: There is abdominal tenderness in the right lower quadrant and suprapubic area. There is no guarding or rebound.  Genitourinary:    General: Normal vulva.     Pubic Area: No rash.      Labia:        Right: No rash, tenderness  or lesion.        Left: No rash, tenderness or lesion.      Vagina: Normal. No vaginal discharge, erythema or tenderness.     Cervix: Normal.     Uterus: Normal. Tender. Not enlarged.      Adnexa: Left adnexa normal.       Right: Tenderness present. No mass.         Left: No mass or tenderness.    Musculoskeletal:        General: Normal range of motion.     Cervical back: Normal range of motion.  Skin:    General: Skin is warm and dry.  Neurological:     General: No focal deficit present.     Mental Status: She is alert and oriented to person, place, and time.  Psychiatric:        Mood and Affect: Mood normal.        Behavior: Behavior normal.        Thought Content: Thought content normal.        Judgment: Judgment normal.    Assessment/Plan: RLQ abdominal pain--very tender on abd/pelvic exam and with movement. Pt looks uncomfortable and tearful due to pain. Doesn't seem c/w routine 3.7 cm hemorrhagic cyst. Instructed pt to go to ED for further imaging/labs/eval. Dr. Georgianne Fick (on call MD) notified.   Hemorrhagic ovarian cyst--per GYN u/s.     Return if symptoms worsen or fail to improve.  Jalani Cullifer B. Raha Tennison, PA-C 11/12/2020 3:44 PM

## 2020-11-13 ENCOUNTER — Emergency Department
Admission: EM | Admit: 2020-11-13 | Discharge: 2020-11-13 | Disposition: A | Discharge status: Home / Self Care | Payer: Medicaid Other | Attending: Emergency Medicine | Admitting: Emergency Medicine

## 2020-11-13 ENCOUNTER — Telehealth: Payer: Self-pay | Admitting: Obstetrics and Gynecology

## 2020-11-13 ENCOUNTER — Emergency Department: Payer: Medicaid Other

## 2020-11-13 DIAGNOSIS — N83209 Unspecified ovarian cyst, unspecified side: Secondary | ICD-10-CM

## 2020-11-13 DIAGNOSIS — R52 Pain, unspecified: Secondary | ICD-10-CM

## 2020-11-13 DIAGNOSIS — R1031 Right lower quadrant pain: Secondary | ICD-10-CM

## 2020-11-13 LAB — RESP PANEL BY RT-PCR (FLU A&B, COVID) ARPGX2
Influenza A by PCR: NEGATIVE
Influenza B by PCR: NEGATIVE
SARS Coronavirus 2 by RT PCR: NEGATIVE

## 2020-11-13 MED ORDER — LACTATED RINGERS IV BOLUS
1000.0000 mL | Freq: Once | INTRAVENOUS | Status: AC
  Administered 2020-11-13: 1000 mL via INTRAVENOUS

## 2020-11-13 MED ORDER — MORPHINE SULFATE (PF) 4 MG/ML IV SOLN
4.0000 mg | Freq: Once | INTRAVENOUS | Status: AC
  Administered 2020-11-13: 4 mg via INTRAVENOUS
  Filled 2020-11-13: qty 1

## 2020-11-13 MED ORDER — ONDANSETRON HCL 4 MG/2ML IJ SOLN
4.0000 mg | Freq: Once | INTRAMUSCULAR | Status: AC
  Administered 2020-11-13: 4 mg via INTRAVENOUS
  Filled 2020-11-13: qty 2

## 2020-11-13 MED ORDER — ONDANSETRON 8 MG PO TBDP
8.0000 mg | ORAL_TABLET | Freq: Once | ORAL | Status: AC
  Administered 2020-11-13: 8 mg via ORAL
  Filled 2020-11-13: qty 1

## 2020-11-13 MED ORDER — OXYCODONE-ACETAMINOPHEN 5-325 MG PO TABS: 1.0000 | ORAL_TABLET | Freq: Four times a day (QID) | ORAL | 0 refills | Status: AC | PRN

## 2020-11-13 MED ORDER — OXYCODONE-ACETAMINOPHEN 5-325 MG PO TABS
2.0000 | ORAL_TABLET | Freq: Once | ORAL | Status: AC
  Administered 2020-11-13: 2 via ORAL
  Filled 2020-11-13: qty 2

## 2020-11-13 MED ORDER — ONDANSETRON 4 MG PO TBDP: 4.0000 mg | ORAL_TABLET | Freq: Three times a day (TID) | ORAL | 0 refills | Status: DC | PRN

## 2020-11-13 NOTE — ED Notes (Signed)
Pt upset about discharge, making comments that removing her ovary is an emergency, advised pt she needs to follow up with OB-GYN, pt continued to make comments stating " I don't know why they take vitals just to throw you out".

## 2020-11-13 NOTE — Telephone Encounter (Signed)
Spoke with pt after ED eval last night. Had neg CT scan for appendix, RTO hemorrhagic cyst slightly bigger but stable. ED discussed with Dr. Bonney Aid. Pt given more pain meds, zofran and told to f/u with [place]. Will have pt see MD early next wk, call us back sooner prn.

## 2020-11-13 NOTE — ED Provider Notes (Signed)
[facility] Emergency Department Provider Note  ____________________________________________  Time seen: Approximately 1:16 AM  I have reviewed the triage vital signs and the nursing notes.   HISTORY  Chief Complaint Abdominal Pain    HPI Lisa Sharp is a 40 y.o. female with a past history of GERD, orthostatic hypotension, ovarian cysts who comes ED complaining of right lower quadrant abdominal pain for the past 10 days.  Pain is gradual onset, constant, waxing waning, worsening, no aggravating or alleviating factors.  Associated with decreased appetite and vomiting over the last few days.  Was recently in the [facility] ED, had ultrasound which showed a hemorrhagic cyst of the area.  She was discharged home at that time.  However, on following up with her gynecologist, it was noted that she is having pretty pronounced pain for an ovarian cyst that is just 4 cm in size, and pain has gone on for longer than would be expected for a cyst.  Patient also notes the pain continues to worsen.  Denies fever or chills.  Sent to the ED by her gynecologist for CT scan.  Patient denies any vaginal discharge or bleeding  Past Medical History:  Diagnosis Date   Anemia    Arthritis    left knee and hips   Asthma    Chronic bronchitis (HCC) 1992   Complication of anesthesia    severe nausea and vomiting   Dyspnea    Family history of adverse reaction to anesthesia    severe nausea and vomiting   GERD (gastroesophageal reflux disease)    Guillain-Barre disease (HCC) 02/17/2003   B leg weakness   Headache    migraines 1-2 per month   Insomnia    Orthostatic hypotension    PONV (postoperative nausea and vomiting)    pain pills given after surgery makes her sick, she voices "all" pain pills makes her sick   Pseudopapilledema of both optic discs 1995   RLS (restless legs syndrome)      Patient Active Problem List   Diagnosis Date Noted   Hemorrhagic ovarian cyst  11/12/2020     Past Surgical History:  Procedure Laterality Date   CHOLECYSTECTOMY  2009   COLONOSCOPY     FINGER FRACTURE SURGERY     GANGLION CYST EXCISION Left 06/12/2014   Procedure: REMOVAL GANGLION OF WRIST;  Surgeon: Kennedy Bucker, MD;  Location: [facility];  Service: Orthopedics;  Laterality: Left;   KNEE ARTHROSCOPY Left 2005   KNEE ARTHROSCOPY WITH PATELLA RECONSTRUCTION Left 12/25/2019   Procedure: Left knee arthroscopy, partial lateral meniscectomy vs lateral meniscus repair, MPFL reconstruction using semitendinosus allograft, and osteochondral allograft to the patella, possible lateral release - Dedra Skeens to Assist;  Surgeon: Signa Kell, MD;  Location: [facility];  Service: Orthopedics;  Laterality: Left;   left knee arthrscopy     TONSILLECTOMY AND ADENOIDECTOMY Bilateral 08/17/2014   Procedure: TONSILLECTOMY AND ADENOIDECTOMY;  Surgeon: Linus Salmons, MD;  Location: [facility];  Service: ENT;  Laterality: Bilateral;   TUBAL LIGATION Bilateral 2010     Prior to Admission medications   Medication Sig Start Date End Date Taking? Authorizing Provider  ondansetron (ZOFRAN ODT) 4 MG disintegrating tablet Take 1 tablet (4 mg total) by mouth every 8 (eight) hours as needed for nausea or vomiting. 11/13/20  Yes Sharman Cheek, MD  oxyCODONE-acetaminophen (PERCOCET) 5-325 MG tablet Take 1-2 tablets by mouth every 6 (six) hours as needed for up to 5 days for severe pain. 11/13/20 11/18/20  Yes Sharman Cheek, MD  acetaminophen (TYLENOL) 500 MG tablet Take 2 tablets (1,000 mg total) by mouth every 8 (eight) hours. 12/25/19 12/24/20  Signa Kell, MD  albuterol (PROVENTIL HFA;VENTOLIN HFA) 108 (90 Base) MCG/ACT inhaler Inhale 2 puffs into the lungs every 6 (six) hours as needed for wheezing or shortness of breath. 09/23/15   Governor Rooks, MD  albuterol (PROVENTIL) (2.5 MG/3ML) 0.083% nebulizer solution Take 3 mLs (2.5 mg total) by nebulization every 4 (four) hours as needed for  wheezing or shortness of breath. 09/23/15   Governor Rooks, MD  benzonatate (TESSALON) 100 MG capsule Take 100 mg by mouth 3 (three) times daily as needed for cough.  11/02/19   [provider]  butalbital-acetaminophen-caffeine (FIORICET) 50-325-40 MG tablet Take 1 tablet by mouth every 6 (six) hours as needed for migraine.  04/20/19   [provider]  cetirizine (ZYRTEC) 10 MG tablet Take 10 mg by mouth daily.    [provider]  famotidine (PEPCID) 20 MG tablet Take 20 mg by mouth 2 (two) times daily.    [provider]  ferrous sulfate 325 (65 FE) MG tablet Take 325 mg by mouth daily with breakfast.    [provider]  fluticasone (FLONASE) 50 MCG/ACT nasal spray Place 1 spray into both nostrils daily.    [provider]  Fluticasone-Salmeterol (ADVAIR) 250-50 MCG/DOSE AEPB Inhale 1 puff into the lungs 2 (two) times daily.    [provider]  ipratropium (ATROVENT) 0.02 % nebulizer solution Take 0.5 mg by nebulization every 6 (six) hours as needed for wheezing or shortness of breath.    [provider]  montelukast (SINGULAIR) 10 MG tablet Take 10 mg by mouth at bedtime.    [provider]  oxybutynin (DITROPAN) 5 MG tablet Take 5 mg by mouth in the morning and at bedtime.    [provider]     Allergies Aspirin; Petroleum jelly [skin protectants, misc.]; Scopolamine; and Verapamil hcl er   Family History  Problem Relation Age of Onset   Fibroids Mother    Fibroids Maternal Grandmother    Uterine cancer Maternal Grandmother    Vaginal cancer Paternal Grandmother     Social History Social History   Tobacco Use   Smoking status: Former    Packs/day: 1.00    Years: 5.00    Pack years: 5.00    Types: Cigarettes   Smokeless tobacco: Never  Vaping Use   Vaping Use: Never used  Substance Use Topics   Alcohol use: Not Currently    Alcohol/week: 1.0 standard drink    Types: 1 Glasses of wine per  week   Drug use: No    Review of Systems  Constitutional:   No fever or chills.  ENT:   No sore throat. No rhinorrhea. Cardiovascular:   No chest pain or syncope. Respiratory:   No dyspnea or cough. Gastrointestinal:   Positive as above for right lower quadrant abdominal pain. Musculoskeletal:   Negative for focal pain or swelling All other systems reviewed and are negative except as documented above in ROS and HPI.  ____________________________________________   PHYSICAL EXAM:  VITAL SIGNS: ED Triage Vitals  Enc Vitals Group     BP 11/12/20 1638 (!) 127/96     Pulse Rate 11/12/20 1636 68     Resp 11/12/20 1636 20     Temp 11/12/20 1636 98.9 F (37.2 C)     Temp Source 11/12/20 1636 Oral     SpO2  11/12/20 1636 98 %     Weight 11/12/20 1636 (!) 380 lb (172.4 kg)     Height 11/12/20 1636 5\' 6"  (1.676 m)     Head Circumference --      Peak Flow --      Pain Score 11/12/20 1636 6     Pain Loc --      Pain Edu? --      Excl. in GC? --     Vital signs reviewed, nursing assessments reviewed.   Constitutional:   Alert and oriented. Non-toxic appearance. Eyes:   Conjunctivae are normal. EOMI. PERRL. ENT      Head:   Normocephalic and atraumatic.      Nose:   Wearing a mask.      Mouth/Throat:   Wearing a mask.      Neck:   No meningismus. Full ROM. Hematological/Lymphatic/Immunilogical:   No cervical lymphadenopathy. Cardiovascular:   RRR. Symmetric bilateral radial and DP pulses.  No murmurs. Cap refill less than 2 seconds. Respiratory:   Normal respiratory effort without tachypnea/retractions. Breath sounds are clear and equal bilaterally. No wheezes/rales/rhonchi. Gastrointestinal:   Soft with right lower quadrant tenderness.  Non distended. There is no CVA tenderness.  No rebound, rigidity, or guarding. Genitourinary:   deferred Musculoskeletal:   Normal range of motion in all extremities. No joint effusions.  No lower extremity tenderness.  No edema. Neurologic:    Normal speech and language.  Motor grossly intact. No acute focal neurologic deficits are appreciated.  Skin:    Skin is warm, dry and intact. No rash noted.  No petechiae, purpura, or bullae.  ____________________________________________    LABS (pertinent positives/negatives) (all labs ordered are listed, but only abnormal results are displayed) Labs Reviewed  COMPREHENSIVE METABOLIC PANEL - Abnormal; Notable for the following components:      Result Value   ALT 56 (*)    All other components within normal limits  URINALYSIS, COMPLETE (UACMP) WITH MICROSCOPIC - Abnormal; Notable for the following components:   Color, Urine YELLOW (*)    APPearance CLEAR (*)    Hgb urine dipstick MODERATE (*)    Ketones, ur 5 (*)    Bacteria, UA RARE (*)    All other components within normal limits  RESP PANEL BY RT-PCR (FLU A&B, COVID) ARPGX2  CBC  POC URINE PREG, ED   ____________________________________________   EKG    ____________________________________________    RADIOLOGY  13/01/22 PELVIC COMPLETE W TRANSVAGINAL AND TORSION R/O  Result Date: 11/12/2020 CLINICAL DATA:  Severe right lower quadrant pain x1 week EXAM: TRANSABDOMINAL AND TRANSVAGINAL ULTRASOUND OF PELVIS DOPPLER ULTRASOUND OF OVARIES TECHNIQUE: Both transabdominal and transvaginal ultrasound examinations of the pelvis were performed. Transabdominal technique was performed for global imaging of the pelvis including uterus, ovaries, adnexal regions, and pelvic cul-de-sac. It was necessary to proceed with endovaginal exam following the transabdominal exam to visualize the endometrium and bilateral ovaries. Color and duplex Doppler ultrasound was utilized to evaluate blood flow to the ovaries. COMPARISON:  None. FINDINGS: Uterus Measurements: 9.6 x 4.9 x 6.4 cm = volume: 159 mL. No fibroids or other mass visualized. Endometrium Thickness: 8 mm.  No focal abnormality visualized. Right ovary Measurements: 6.1 x 3.9 x 4.5 cm =  volume: 56 mL. 4.2 x 3.5 x 3.9 cm cyst with layering hemorrhage, benign. Left ovary Measurements: 3.3 x 1.8 x 3.0 cm = volume: 9 mL. Normal appearance/no adnexal mass. Pulsed Doppler evaluation of both ovaries demonstrates normal low-resistance arterial and venous  waveforms. Other findings Trace pelvic fluid, physiologic. IMPRESSION: 4.2 cm benign hemorrhagic cyst in the right ovary. No follow-up required. No evidence of ovarian torsion. Electronically Signed   By: Charline Bills M.D.   On: 11/12/2020 22:45    ____________________________________________   PROCEDURES Procedures  ____________________________________________  DIFFERENTIAL DIAGNOSIS   Appendicitis, ruptured ovarian cyst, torsion, cystitis  CLINICAL IMPRESSION / ASSESSMENT AND PLAN / ED COURSE  Medications ordered in the ED: Medications  ondansetron (ZOFRAN-ODT) disintegrating tablet 8 mg (has no administration in time range)  oxyCODONE-acetaminophen (PERCOCET/ROXICET) 5-325 MG per tablet 2 tablet (has no administration in time range)  morphine 4 MG/ML injection 4 mg (4 mg Intravenous Given 11/13/20 0137)  ondansetron (ZOFRAN) injection 4 mg (4 mg Intravenous Given 11/13/20 0137)  lactated ringers bolus 1,000 mL (0 mLs Intravenous Stopped 11/13/20 0200)  morphine 4 MG/ML injection 4 mg (4 mg Intravenous Given 11/13/20 0447)    Pertinent labs & imaging results that were available during my care of the patient were reviewed by me and considered in my medical decision making (see chart for details).  MADY OUBRE was evaluated in Emergency Department on 11/13/2020 for the symptoms described in the history of present illness. She was evaluated in the context of the global COVID-19 pandemic, which necessitated consideration that the patient might be at risk for infection with the SARS-CoV-2 virus that causes COVID-19. Institutional protocols and algorithms that pertain to the evaluation of patients at risk for COVID-19 are in a  state of rapid change based on information released by regulatory bodies including the CDC and federal and state organizations. These policies and algorithms were followed during the patient's care in the ED.   Patient presents with right lower quadrant abdominal pain for the past 10 days.  Ultrasound today demonstrates 4 cm hemorrhagic ovarian cyst on the right.  However pain has lasted longer than is typical for her with ovarian cyst.  Will obtain CT scan to evaluate for appendicitis.  ----------------------------------------- 5:41 AM on 11/13/2020 ----------------------------------------- CT unremarkable except R ov cyst as seen before. No appendicitis or other acute issues.  D/w Gyn Dr. Bonney Aid who recs continued outpatient f/u and pain control. Pt updated on findings and plan of care. Rx for zofran and perc sent to pharmacy. Sx not c/w torsion, doubt STI, PID, TOA, mes. Ischemia, dissection.     ____________________________________________   FINAL CLINICAL IMPRESSION(S) / ED DIAGNOSES    Final diagnoses:  Pain  RLQ abdominal pain  Hemorrhagic ovarian cyst     ED Discharge Orders          Ordered    oxyCODONE-acetaminophen (PERCOCET) 5-325 MG tablet  Every 6 hours PRN        11/13/20 0540    ondansetron (ZOFRAN ODT) 4 MG disintegrating tablet  Every 8 hours PRN        11/13/20 0540            Portions of this note were generated with dragon dictation software. Dictation errors may occur despite best attempts at proofreading.    Sharman Cheek, MD 11/13/20 [phone]

## 2020-11-18 ENCOUNTER — Ambulatory Visit (INDEPENDENT_AMBULATORY_CARE_PROVIDER_SITE_OTHER): Payer: Medicaid Other | Admitting: Obstetrics and Gynecology

## 2020-11-18 ENCOUNTER — Encounter
Admission: RE | Disposition: A | Discharge status: Home / Self Care | Payer: Self-pay | Source: Home / Self Care | Attending: Obstetrics and Gynecology

## 2020-11-18 ENCOUNTER — Other Ambulatory Visit: Payer: Self-pay

## 2020-11-18 ENCOUNTER — Encounter: Payer: Self-pay | Admitting: Obstetrics and Gynecology

## 2020-11-18 ENCOUNTER — Ambulatory Visit
Admission: RE | Admit: 2020-11-18 | Discharge: 2020-11-18 | Disposition: A | Discharge status: Home / Self Care | Payer: Medicaid Other | Attending: Obstetrics and Gynecology | Admitting: Obstetrics and Gynecology

## 2020-11-18 ENCOUNTER — Ambulatory Visit: Payer: Medicaid Other | Admitting: Anesthesiology

## 2020-11-18 VITALS — BP 124/72 | Ht 66.0 in | Wt 282.0 lb

## 2020-11-18 DIAGNOSIS — G43909 Migraine, unspecified, not intractable, without status migrainosus: Secondary | ICD-10-CM | POA: Insufficient documentation

## 2020-11-18 DIAGNOSIS — Z87891 Personal history of nicotine dependence: Secondary | ICD-10-CM | POA: Diagnosis not present

## 2020-11-18 DIAGNOSIS — R1031 Right lower quadrant pain: Secondary | ICD-10-CM | POA: Diagnosis present

## 2020-11-18 DIAGNOSIS — J45909 Unspecified asthma, uncomplicated: Secondary | ICD-10-CM | POA: Insufficient documentation

## 2020-11-18 DIAGNOSIS — N83209 Unspecified ovarian cyst, unspecified side: Secondary | ICD-10-CM | POA: Diagnosis not present

## 2020-11-18 DIAGNOSIS — R112 Nausea with vomiting, unspecified: Secondary | ICD-10-CM | POA: Diagnosis not present

## 2020-11-18 DIAGNOSIS — K219 Gastro-esophageal reflux disease without esophagitis: Secondary | ICD-10-CM | POA: Diagnosis not present

## 2020-11-18 DIAGNOSIS — N838 Other noninflammatory disorders of ovary, fallopian tube and broad ligament: Secondary | ICD-10-CM | POA: Insufficient documentation

## 2020-11-18 DIAGNOSIS — Z9851 Tubal ligation status: Secondary | ICD-10-CM | POA: Insufficient documentation

## 2020-11-18 DIAGNOSIS — N83201 Unspecified ovarian cyst, right side: Secondary | ICD-10-CM | POA: Insufficient documentation

## 2020-11-18 DIAGNOSIS — Z9049 Acquired absence of other specified parts of digestive tract: Secondary | ICD-10-CM | POA: Insufficient documentation

## 2020-11-18 DIAGNOSIS — D649 Anemia, unspecified: Secondary | ICD-10-CM | POA: Diagnosis not present

## 2020-11-18 HISTORY — PX: ROBOTIC ASSISTED LAPAROSCOPIC OVARIAN CYSTECTOMY: SHX6081

## 2020-11-18 LAB — TYPE AND SCREEN
ABO/RH(D): O POS
Antibody Screen: NEGATIVE

## 2020-11-18 LAB — POCT PREGNANCY, URINE: Preg Test, Ur: NEGATIVE

## 2020-11-18 SURGERY — EXCISION, CYST, OVARY, ROBOT-ASSISTED, LAPAROSCOPIC
Anesthesia: General | Laterality: Right

## 2020-11-18 MED ORDER — OXYCODONE HCL 5 MG PO TABS: 5.0000 mg | ORAL_TABLET | Freq: Once | ORAL | Status: AC | PRN

## 2020-11-18 MED ORDER — PROPOFOL 10 MG/ML IV BOLUS
INTRAVENOUS | Status: DC | PRN
  Administered 2020-11-18: 30 mg via INTRAVENOUS
  Administered 2020-11-18: 200 mg via INTRAVENOUS

## 2020-11-18 MED ORDER — PROPOFOL 500 MG/50ML IV EMUL
INTRAVENOUS | Status: AC
  Filled 2020-11-18: qty 50

## 2020-11-18 MED ORDER — APREPITANT 40 MG PO CAPS
ORAL_CAPSULE | ORAL | Status: AC
  Filled 2020-11-18: qty 1

## 2020-11-18 MED ORDER — ACETAMINOPHEN 10 MG/ML IV SOLN
INTRAVENOUS | Status: AC
  Filled 2020-11-18: qty 100

## 2020-11-18 MED ORDER — CHLORHEXIDINE GLUCONATE 0.12 % MT SOLN
OROMUCOSAL | Status: AC
  Administered 2020-11-18: 15 mL via OROMUCOSAL
  Filled 2020-11-18: qty 15

## 2020-11-18 MED ORDER — FENTANYL CITRATE (PF) 100 MCG/2ML IJ SOLN
INTRAMUSCULAR | Status: AC
  Filled 2020-11-18: qty 2

## 2020-11-18 MED ORDER — PHENYLEPHRINE HCL (PRESSORS) 10 MG/ML IV SOLN
INTRAVENOUS | Status: DC | PRN
  Administered 2020-11-18 (×3): 200 ug via INTRAVENOUS
  Administered 2020-11-18: 100 ug via INTRAVENOUS

## 2020-11-18 MED ORDER — SILVER NITRATE-POT NITRATE 75-25 % EX MISC
CUTANEOUS | Status: DC | PRN
  Administered 2020-11-18: 2

## 2020-11-18 MED ORDER — OXYCODONE HCL 5 MG PO TABS: 5.0000 mg | ORAL_TABLET | ORAL | 0 refills | Status: DC | PRN

## 2020-11-18 MED ORDER — FENTANYL CITRATE (PF) 100 MCG/2ML IJ SOLN
INTRAMUSCULAR | Status: DC | PRN
  Administered 2020-11-18 (×3): 50 ug via INTRAVENOUS

## 2020-11-18 MED ORDER — ONDANSETRON HCL 4 MG/2ML IJ SOLN
INTRAMUSCULAR | Status: DC | PRN
  Administered 2020-11-18: 4 mg via INTRAVENOUS

## 2020-11-18 MED ORDER — ROCURONIUM BROMIDE 100 MG/10ML IV SOLN
INTRAVENOUS | Status: DC | PRN
  Administered 2020-11-18 (×2): 20 mg via INTRAVENOUS
  Administered 2020-11-18: 30 mg via INTRAVENOUS
  Administered 2020-11-18: 20 mg via INTRAVENOUS

## 2020-11-18 MED ORDER — FENTANYL CITRATE (PF) 100 MCG/2ML IJ SOLN
INTRAMUSCULAR | Status: AC
  Administered 2020-11-18: 50 ug via INTRAVENOUS
  Filled 2020-11-18: qty 2

## 2020-11-18 MED ORDER — MIDAZOLAM HCL 2 MG/2ML IJ SOLN
INTRAMUSCULAR | Status: DC | PRN
  Administered 2020-11-18: 2 mg via INTRAVENOUS

## 2020-11-18 MED ORDER — APREPITANT 40 MG PO CAPS
40.0000 mg | ORAL_CAPSULE | Freq: Once | ORAL | Status: AC
  Administered 2020-11-18: 40 mg via ORAL

## 2020-11-18 MED ORDER — ONDANSETRON 4 MG PO TBDP: 4.0000 mg | ORAL_TABLET | Freq: Four times a day (QID) | ORAL | 0 refills | Status: AC | PRN

## 2020-11-18 MED ORDER — DEXAMETHASONE SODIUM PHOSPHATE 10 MG/ML IJ SOLN
INTRAMUSCULAR | Status: AC
  Filled 2020-11-18: qty 1

## 2020-11-18 MED ORDER — LACTATED RINGERS IV SOLN: INTRAVENOUS | Status: DC | PRN

## 2020-11-18 MED ORDER — PROMETHAZINE HCL 25 MG/ML IJ SOLN: 6.2500 mg | INTRAMUSCULAR | Status: DC | PRN

## 2020-11-18 MED ORDER — ACETAMINOPHEN 10 MG/ML IV SOLN
INTRAVENOUS | Status: DC | PRN
  Administered 2020-11-18: 1000 mg via INTRAVENOUS

## 2020-11-18 MED ORDER — SOD CITRATE-CITRIC ACID 500-334 MG/5ML PO SOLN
30.0000 mL | Freq: Once | ORAL | Status: AC
  Administered 2020-11-18: 30 mL via ORAL
  Filled 2020-11-18: qty 15
  Filled 2020-11-18: qty 30

## 2020-11-18 MED ORDER — PROPOFOL 500 MG/50ML IV EMUL
INTRAVENOUS | Status: DC | PRN
  Administered 2020-11-18: 150 ug/kg/min via INTRAVENOUS

## 2020-11-18 MED ORDER — IBUPROFEN 600 MG PO TABS: 600.0000 mg | ORAL_TABLET | Freq: Four times a day (QID) | ORAL | 0 refills | Status: AC | PRN

## 2020-11-18 MED ORDER — DEXMEDETOMIDINE (PRECEDEX) IN NS 20 MCG/5ML (4 MCG/ML) IV SYRINGE
PREFILLED_SYRINGE | INTRAVENOUS | Status: DC | PRN
  Administered 2020-11-18: 12 ug via INTRAVENOUS

## 2020-11-18 MED ORDER — PROPOFOL 10 MG/ML IV BOLUS
INTRAVENOUS | Status: AC
  Filled 2020-11-18: qty 40

## 2020-11-18 MED ORDER — CHLORHEXIDINE GLUCONATE 0.12 % MT SOLN: 15.0000 mL | Freq: Once | OROMUCOSAL | Status: AC

## 2020-11-18 MED ORDER — SUCCINYLCHOLINE CHLORIDE 200 MG/10ML IV SOSY
PREFILLED_SYRINGE | INTRAVENOUS | Status: DC | PRN
  Administered 2020-11-18: 130 mg via INTRAVENOUS

## 2020-11-18 MED ORDER — ONDANSETRON HCL 4 MG/2ML IJ SOLN
INTRAMUSCULAR | Status: AC
  Filled 2020-11-18: qty 2

## 2020-11-18 MED ORDER — ROCURONIUM BROMIDE 10 MG/ML (PF) SYRINGE
PREFILLED_SYRINGE | INTRAVENOUS | Status: AC
  Filled 2020-11-18: qty 10

## 2020-11-18 MED ORDER — GABAPENTIN 300 MG PO CAPS
ORAL_CAPSULE | ORAL | Status: AC
  Administered 2020-11-18: 600 mg via ORAL
  Filled 2020-11-18: qty 2

## 2020-11-18 MED ORDER — SUGAMMADEX SODIUM 200 MG/2ML IV SOLN
INTRAVENOUS | Status: DC | PRN
  Administered 2020-11-18: 300 mg via INTRAVENOUS

## 2020-11-18 MED ORDER — ONDANSETRON HCL 4 MG/2ML IJ SOLN: 4.0000 mg | Freq: Once | INTRAMUSCULAR | Status: AC

## 2020-11-18 MED ORDER — ORAL CARE MOUTH RINSE: 15.0000 mL | Freq: Once | OROMUCOSAL | Status: AC

## 2020-11-18 MED ORDER — GABAPENTIN 300 MG PO CAPS: 600.0000 mg | ORAL_CAPSULE | Freq: Once | ORAL | Status: AC

## 2020-11-18 MED ORDER — LACTATED RINGERS IV SOLN: INTRAVENOUS | Status: DC

## 2020-11-18 MED ORDER — MIDAZOLAM HCL 2 MG/2ML IJ SOLN
INTRAMUSCULAR | Status: AC
  Filled 2020-11-18: qty 2

## 2020-11-18 MED ORDER — DEXAMETHASONE SODIUM PHOSPHATE 10 MG/ML IJ SOLN
INTRAMUSCULAR | Status: DC | PRN
  Administered 2020-11-18: 10 mg via INTRAVENOUS

## 2020-11-18 MED ORDER — GLYCOPYRROLATE 0.2 MG/ML IJ SOLN
INTRAMUSCULAR | Status: AC
  Filled 2020-11-18: qty 1

## 2020-11-18 MED ORDER — PHENYLEPHRINE HCL-NACL 20-0.9 MG/250ML-% IV SOLN
INTRAVENOUS | Status: AC
  Filled 2020-11-18: qty 250

## 2020-11-18 MED ORDER — LIDOCAINE HCL (CARDIAC) PF 100 MG/5ML IV SOSY
PREFILLED_SYRINGE | INTRAVENOUS | Status: DC | PRN
  Administered 2020-11-18: 100 mg via INTRAVENOUS

## 2020-11-18 MED ORDER — ACETAMINOPHEN 10 MG/ML IV SOLN
1000.0000 mg | Freq: Once | INTRAVENOUS | Status: AC
  Administered 2020-11-18: 1000 mg via INTRAVENOUS

## 2020-11-18 MED ORDER — PHENYLEPHRINE HCL (PRESSORS) 10 MG/ML IV SOLN
INTRAVENOUS | Status: AC
  Filled 2020-11-18: qty 1

## 2020-11-18 MED ORDER — OXYCODONE HCL 5 MG/5ML PO SOLN
5.0000 mg | Freq: Once | ORAL | Status: AC | PRN
  Administered 2020-11-18: 5 mg via ORAL

## 2020-11-18 MED ORDER — POVIDONE-IODINE 10 % EX SWAB: 2.0000 "application " | Freq: Once | CUTANEOUS | Status: DC

## 2020-11-18 MED ORDER — SILVER NITRATE-POT NITRATE 75-25 % EX MISC
CUTANEOUS | Status: AC
  Filled 2020-11-18: qty 20

## 2020-11-18 MED ORDER — ALBUTEROL SULFATE HFA 108 (90 BASE) MCG/ACT IN AERS
INHALATION_SPRAY | RESPIRATORY_TRACT | Status: DC | PRN
  Administered 2020-11-18: 4 via RESPIRATORY_TRACT

## 2020-11-18 MED ORDER — BUPIVACAINE HCL (PF) 0.5 % IJ SOLN
INTRAMUSCULAR | Status: AC
  Filled 2020-11-18: qty 30

## 2020-11-18 MED ORDER — GLYCOPYRROLATE 0.2 MG/ML IJ SOLN
INTRAMUSCULAR | Status: DC | PRN
  Administered 2020-11-18: .2 mg via INTRAVENOUS

## 2020-11-18 MED ORDER — BUPIVACAINE HCL 0.5 % IJ SOLN
INTRAMUSCULAR | Status: DC | PRN
  Administered 2020-11-18: 8 mL
  Administered 2020-11-18: 5 mL

## 2020-11-18 MED ORDER — ONDANSETRON HCL 4 MG/2ML IJ SOLN
INTRAMUSCULAR | Status: AC
  Administered 2020-11-18: 4 mg via INTRAVENOUS
  Filled 2020-11-18: qty 2

## 2020-11-18 MED ORDER — OXYCODONE HCL 5 MG PO TABS
ORAL_TABLET | ORAL | Status: AC
  Filled 2020-11-18: qty 1

## 2020-11-18 MED ORDER — LIDOCAINE HCL (PF) 2 % IJ SOLN
INTRAMUSCULAR | Status: AC
  Filled 2020-11-18: qty 5

## 2020-11-18 MED ORDER — ACETAMINOPHEN 10 MG/ML IV SOLN: 1000.0000 mg | Freq: Once | INTRAVENOUS | Status: DC | PRN

## 2020-11-18 MED ORDER — FENTANYL CITRATE (PF) 100 MCG/2ML IJ SOLN
25.0000 ug | INTRAMUSCULAR | Status: DC | PRN
Start: 2020-11-18 — End: 2020-11-19
  Administered 2020-11-18: 50 ug via INTRAVENOUS

## 2020-11-18 SURGICAL SUPPLY — 68 items
BAG URINE DRAIN 2000ML AR STRL (UROLOGICAL SUPPLIES) ×2 IMPLANT
BLADE SURG SZ11 CARB STEEL (BLADE) ×2 IMPLANT
CATH FOLEY 2WAY  5CC 16FR (CATHETERS) ×1
CATH URTH 16FR FL 2W BLN LF (CATHETERS) ×1 IMPLANT
CHLORAPREP W/TINT 26 (MISCELLANEOUS) ×2 IMPLANT
COVER TIP SHEARS 8 DVNC (MISCELLANEOUS) ×1 IMPLANT
COVER TIP SHEARS 8MM DA VINCI (MISCELLANEOUS) ×1
DEFOGGER SCOPE WARMER CLEARIFY (MISCELLANEOUS) ×2 IMPLANT
DERMABOND ADVANCED (GAUZE/BANDAGES/DRESSINGS) ×1
DERMABOND ADVANCED .7 DNX12 (GAUZE/BANDAGES/DRESSINGS) ×1 IMPLANT
DRAPE 3/4 80X56 (DRAPES) ×2 IMPLANT
DRAPE ARM DVNC X/XI (DISPOSABLE) ×3 IMPLANT
DRAPE DA VINCI XI ARM (DISPOSABLE) ×3
DRAPE LEGGINS SURG 28X43 STRL (DRAPES) ×2 IMPLANT
DRAPE UNDER BUTTOCK W/FLU (DRAPES) ×2 IMPLANT
ELECT REM PT RETURN 9FT ADLT (ELECTROSURGICAL) ×2
ELECTRODE REM PT RTRN 9FT ADLT (ELECTROSURGICAL) ×1 IMPLANT
GAUZE 4X4 16PLY ~~LOC~~+RFID DBL (SPONGE) ×2 IMPLANT
GLOVE SURG ENC MOIS LTX SZ7 (GLOVE) ×10 IMPLANT
GLOVE SURG UNDER POLY LF SZ7.5 (GLOVE) ×10 IMPLANT
GOWN STRL REUS W/ TWL LRG LVL3 (GOWN DISPOSABLE) ×6 IMPLANT
GOWN STRL REUS W/TWL LRG LVL3 (GOWN DISPOSABLE) ×6
GRASPER SUT TROCAR 14GX15 (MISCELLANEOUS) IMPLANT
IRRIGATION STRYKERFLOW (MISCELLANEOUS) IMPLANT
IRRIGATOR STRYKERFLOW (MISCELLANEOUS)
IV NS 1000ML (IV SOLUTION) ×1
IV NS 1000ML BAXH (IV SOLUTION) ×1 IMPLANT
KIT PINK PAD W/HEAD ARE REST (MISCELLANEOUS) ×2 IMPLANT
KIT PINK PAD W/HEAD ARM REST (MISCELLANEOUS) ×1 IMPLANT
KIT TURNOVER CYSTO (KITS) IMPLANT
LABEL OR SOLS (LABEL) ×2 IMPLANT
MANIFOLD NEPTUNE II (INSTRUMENTS) ×2 IMPLANT
MANIPULATOR UTERINE 4.5 ZUMI (MISCELLANEOUS) ×2 IMPLANT
MANIPULATOR VCARE LG CRV RETR (MISCELLANEOUS) IMPLANT
MANIPULATOR VCARE SML CRV RETR (MISCELLANEOUS) IMPLANT
MANIPULATOR VCARE STD CRV RETR (MISCELLANEOUS) IMPLANT
NEEDLE HYPO 22GX1.5 SAFETY (NEEDLE) ×2 IMPLANT
NS IRRIG 1000ML POUR BTL (IV SOLUTION) ×2 IMPLANT
OBTURATOR OPTICAL STANDARD 8MM (TROCAR) ×1
OBTURATOR OPTICAL STND 8 DVNC (TROCAR) ×1
OBTURATOR OPTICALSTD 8 DVNC (TROCAR) ×1 IMPLANT
OCCLUDER COLPOPNEUMO (BALLOONS) ×2 IMPLANT
PACK LAP CHOLECYSTECTOMY (MISCELLANEOUS) ×2 IMPLANT
PAD ARMBOARD 7.5X6 YLW CONV (MISCELLANEOUS) IMPLANT
PAD OB MATERNITY 4.3X12.25 (PERSONAL CARE ITEMS) ×2 IMPLANT
PAD PREP 24X41 OB/GYN DISP (PERSONAL CARE ITEMS) ×2 IMPLANT
SCRUB EXIDINE 4% CHG 4OZ (MISCELLANEOUS) ×2 IMPLANT
SEAL CANN UNIV 5-8 DVNC XI (MISCELLANEOUS) ×3 IMPLANT
SEAL XI 5MM-8MM UNIVERSAL (MISCELLANEOUS) ×3
SOLUTION ELECTROLUBE (MISCELLANEOUS) ×2 IMPLANT
SPONGE T-LAP 18X18 ~~LOC~~+RFID (SPONGE) IMPLANT
SURGILUBE 2OZ TUBE FLIPTOP (MISCELLANEOUS) ×2 IMPLANT
SUT DVC VLOC 180 0 12IN GS21 (SUTURE)
SUT MNCRL AB 4-0 PS2 18 (SUTURE) ×2 IMPLANT
SUT VIC AB 0 CT2 27 (SUTURE) IMPLANT
SUT VIC AB 1 CT1 36 (SUTURE) IMPLANT
SUT VIC AB 2-0 CT1 27 (SUTURE) ×1
SUT VIC AB 2-0 CT1 TAPERPNT 27 (SUTURE) ×1 IMPLANT
SUT VIC AB 4-0 SH 27 (SUTURE) ×1
SUT VIC AB 4-0 SH 27XANBCTRL (SUTURE) ×1 IMPLANT
SUT VICRYL 0 AB UR-6 (SUTURE) IMPLANT
SUTURE DVC VLC 180 0 12IN GS21 (SUTURE) IMPLANT
SYR 10ML LL (SYRINGE) ×2 IMPLANT
SYR 50ML LL SCALE MARK (SYRINGE) IMPLANT
TUBING EVAC SMOKE HEATED PNEUM (TUBING) ×2 IMPLANT
WAND RF SURG SPNG DETECT SYS (INSTRUMENTS) ×2 IMPLANT
WATER STERILE IRR 500ML POUR (IV SOLUTION) ×2 IMPLANT
kronner manipujector uterine manipulator-injector Sub for Lawson 17411 ×2 IMPLANT

## 2020-11-18 NOTE — H&P (View-Only) (Signed)
Patient ID: Lisa Sharp, female   DOB: 11-10-1980, 40 y.o.   MRN: [id number]  Reason for Consult: Follow-up   Referred by [facility]  Subjective:     HPI:  Lisa Sharp is a 40 y.o. female she presents today to follow-up regarding a right ovarian cyst.  She reports that 2 weeks ago she had a sudden onset of right-sided pain.  She initially thought that this was gas pain since that became the last but it has been feeling sharp and stabbing recently.  She reports that she was seen in the [facility] ER and was diagnosed with a right hemorrhagic cyst.  She has had several hemorrhagic cyst before but this pain feels more severe and like it is getting worse.  She was seen recently in the [facility] ER and had a CT which did not show evidence of appendicitis or other acute acute cause of her abdominal pain.  She was prescribed opiate pain medicine which she has not received some relief from but not significant improvement.  She reports that recently she has not been able to eat.  Reports that she is having nausea and vomiting and has not been able to keep anything down since Saturday.  She is not sleeping well and that she is urinating on herself multiple times a day because of the pain.  She is visibly shaking from pain in the office she is tearful.  She is requesting a garbage bag which she reports she has been vomiting and 2.  Pelvic ultrasound and [facility] ER showed normal flow to the right ovary.  Gynecological History  Patient's last menstrual period was 11/12/2020 (exact date).  History of fibroids, polyps, or ovarian cysts? : history of ovarian cysts twice before  History of PCOS? no Hstory of Endometriosis? no History of abnormal pap smears? Yes in 2009 Have you had any sexually transmitted infections in the past? no  Last Pap: Results were: 11/02/2018 NIL and HR HPV negative    She identifies as a female. She is sexually active with men.   She denies dyspareunia. She denies  postcoital bleeding.  She currently uses tubal ligation for contraception.   Obstetrical History OB History  Gravida Para Term Preterm AB Living  3 2 1 1 1 2   SAB IAB Ectopic Multiple Live Births  1       2    # Outcome Date GA Lbr Len/2nd Weight Sex Delivery Anes PTL Lv  3 SAB           2 Preterm           1 Term              Past Medical History:  Diagnosis Date   Anemia    Arthritis    left knee and hips   Asthma    Chronic bronchitis (HCC) 0000000   Complication of anesthesia    severe nausea and vomiting   Dyspnea    Family history of adverse reaction to anesthesia    severe nausea and vomiting   GERD (gastroesophageal reflux disease)    Guillain-Barre disease ([place]) 02/17/2003   B leg weakness   Headache    migraines 1-2 per month   Insomnia    Orthostatic hypotension    PONV (postoperative nausea and vomiting)    pain pills given after surgery makes her sick, she voices "all" pain pills makes her sick   Pseudopapilledema of both optic discs 1995  RLS (restless legs syndrome)    Family History  Problem Relation Age of Onset   Fibroids Mother    Fibroids Maternal Grandmother    Uterine cancer Maternal Grandmother    Vaginal cancer Paternal Grandmother    Past Surgical History:  Procedure Laterality Date   CHOLECYSTECTOMY  2009   COLONOSCOPY     FINGER FRACTURE SURGERY     GANGLION CYST EXCISION Left 06/12/2014   Procedure: REMOVAL GANGLION OF WRIST;  Surgeon: Hessie Knows, MD;  Location: [facility] ORS;  Service: Orthopedics;  Laterality: Left;   KNEE ARTHROSCOPY Left 2005   KNEE ARTHROSCOPY WITH PATELLA RECONSTRUCTION Left 12/25/2019   Procedure: Left knee arthroscopy, partial lateral meniscectomy vs lateral meniscus repair, MPFL reconstruction using semitendinosus allograft, and osteochondral allograft to the patella, possible lateral release - Reche Dixon to Assist;  Surgeon: Leim Fabry, MD;  Location: [facility] ORS;  Service: Orthopedics;  Laterality: Left;   left  knee arthrscopy     TONSILLECTOMY AND ADENOIDECTOMY Bilateral 08/17/2014   Procedure: TONSILLECTOMY AND ADENOIDECTOMY;  Surgeon: Beverly Gust, MD;  Location: [place];  Service: ENT;  Laterality: Bilateral;   TUBAL LIGATION Bilateral 2010    Short Social History:  Social History   Tobacco Use   Smoking status: Former    Packs/day: 1.00    Years: 5.00    Pack years: 5.00    Types: Cigarettes   Smokeless tobacco: Never  Substance Use Topics   Alcohol use: Not Currently    Alcohol/week: 1.0 standard drink    Types: 1 Glasses of wine per week    Allergies  Allergen Reactions   Aspirin Anaphylaxis   Petroleum Jelly [Skin Protectants, Misc.] Other (See Comments)    Causes skin burn   Scopolamine     Hx guillain barre syndrome and pt states she is not able to have scopolamine patch.   Verapamil Hcl Er Other (See Comments)    Extreme muscle pain    Current Outpatient Medications  Medication Sig Dispense Refill   acetaminophen (TYLENOL) 500 MG tablet Take 2 tablets (1,000 mg total) by mouth every 8 (eight) hours. 90 tablet 2   albuterol (PROVENTIL HFA;VENTOLIN HFA) 108 (90 Base) MCG/ACT inhaler Inhale 2 puffs into the lungs every 6 (six) hours as needed for wheezing or shortness of breath. 1 Inhaler 0   albuterol (PROVENTIL) (2.5 MG/3ML) 0.083% nebulizer solution Take 3 mLs (2.5 mg total) by nebulization every 4 (four) hours as needed for wheezing or shortness of breath. 75 mL 0   benzonatate (TESSALON) 100 MG capsule Take 100 mg by mouth 3 (three) times daily as needed for cough.      butalbital-acetaminophen-caffeine (FIORICET) 50-325-40 MG tablet Take 1 tablet by mouth every 6 (six) hours as needed for migraine.      cetirizine (ZYRTEC) 10 MG tablet Take 10 mg by mouth daily.     famotidine (PEPCID) 20 MG tablet Take 20 mg by mouth 2 (two) times daily.     ferrous sulfate 325 (65 FE) MG tablet Take 325 mg by mouth daily with breakfast.     fluticasone (FLONASE) 50  MCG/ACT nasal spray Place 1 spray into both nostrils daily.     Fluticasone-Salmeterol (ADVAIR) 250-50 MCG/DOSE AEPB Inhale 1 puff into the lungs 2 (two) times daily.     ipratropium (ATROVENT) 0.02 % nebulizer solution Take 0.5 mg by nebulization every 6 (six) hours as needed for wheezing or shortness of breath.     montelukast (SINGULAIR) 10 MG tablet  Take 10 mg by mouth at bedtime.     ondansetron (ZOFRAN ODT) 4 MG disintegrating tablet Take 1 tablet (4 mg total) by mouth every 8 (eight) hours as needed for nausea or vomiting. 20 tablet 0   oxybutynin (DITROPAN) 5 MG tablet Take 5 mg by mouth in the morning and at bedtime.     oxyCODONE-acetaminophen (PERCOCET) 5-325 MG tablet Take 1-2 tablets by mouth every 6 (six) hours as needed for up to 5 days for severe pain. 20 tablet 0   No current facility-administered medications for this visit.    Review of Systems  Constitutional: Negative for chills, fatigue, fever and unexpected weight change.  HENT: Negative for trouble swallowing.  Eyes: Negative for loss of vision.  Respiratory: Negative for cough, shortness of breath and wheezing.  Cardiovascular: Negative for chest pain, leg swelling, palpitations and syncope.  GI: Negative for abdominal pain, blood in stool, diarrhea, nausea and vomiting.  GU: Negative for difficulty urinating, dysuria, frequency and hematuria.  Musculoskeletal: Negative for back pain, leg pain and joint pain.  Skin: Negative for rash.  Neurological: Negative for dizziness, headaches, light-headedness, numbness and seizures.  Psychiatric: Negative for behavioral problem, confusion, depressed mood and sleep disturbance.       Objective:  Objective   Vitals:   11/18/20 1106  BP: 124/72  Weight: 282 lb (127.9 kg)  Height: 5\' 6"  (1.676 m)   Body mass index is 45.52 kg/m.  Physical Exam Vitals and nursing note reviewed. Exam conducted with a chaperone present.  Constitutional:      Appearance: Normal  appearance. She is well-developed.  HENT:     Head: Normocephalic and atraumatic.  Eyes:     Extraocular Movements: Extraocular movements intact.     Pupils: Pupils are equal, round, and reactive to light.  Cardiovascular:     Rate and Rhythm: Normal rate and regular rhythm.  Pulmonary:     Effort: Pulmonary effort is normal. No respiratory distress.     Breath sounds: Normal breath sounds.  Abdominal:     General: Abdomen is flat.     Palpations: Abdomen is soft.  Genitourinary:    Comments: External: Normal appearing vulva. No lesions noted.   Bimanual examination: Uterus midline, tender, normal in size, shape and contour.  No CMT. No left adnexal masses or tenderness. + RIGHT adnexal tenderness and enlargement. Pelvis not fixed.  Breast exam: exam not performed Musculoskeletal:        General: No signs of injury.  Skin:    General: Skin is warm and dry.  Neurological:     Mental Status: She is alert and oriented to person, place, and time.  Psychiatric:        Behavior: Behavior normal.        Thought Content: Thought content normal.        Judgment: Judgment normal.    Assessment/Plan:     40 year old with severe right-sided abdominal pain.  Likely secondary to hemorrhagic ovarian cyst, possible ovarian torsion  Given the patient's severe pain discussed with Dr. 41 the physician on-call.  He is willing to take the patient for emergency surgery later this evening.  Patient understands that she should remain NPO.  We discussed the risk of the surgery including risk of infection risk of damage to surrounding pelvic tissues and risk of bleeding.  Patient understands these risks and was willing to have surgery.  Discussed robotic assisted laparoscopic right ovarian cystectomy with bilateral salpingectomy because the patient has had  a previous tubal ligation and does not pregnancy.  Her CT scan has showed a displaced tubal clip.  We also discussed the possibility of a  right oophorectomy if torsion is present in the ovary is necrotic.  Presciptions for postoperative pain medications sent to her pharmacy.  More than 30 minutes were spent face to face with the patient in the room, reviewing the medical record, labs and images, and coordinating care for the patient. The plan of management was discussed in detail and counseling was provided.     Adrian Prows MD [facility], [facility] 11/18/2020 11:37 AM

## 2020-11-18 NOTE — Patient Instructions (Signed)
Ovarian Cystectomy, Care After °This sheet gives you information about how to care for yourself after your procedure. Your health care provider may also give you more specific instructions. If you have problems or questions, contact your health care provider. °What can I expect after the procedure? °After the procedure, it is common to have: °Pain in the abdomen, especially at the incision areas. You will be given pain medicine to control the pain. °Tiredness. This is a normal part of the recovery process. Your energy level will return to normal over the next several weeks. °Follow these instructions at home: °Medicines °Take over-the-counter and prescription medicines only as told by your health care provider. °If you were prescribed an antibiotic medicine, use it as told by your health care provider. Do not stop using the antibiotic even if you start to feel better. °Do not take aspirin because it can cause bleeding. °Ask your health care provider if the medicine prescribed to you: °Requires you to avoid driving or using heavy machinery. °Can cause constipation. You may need to take these actions to prevent or treat constipation: °Drink enough fluid to keep your urine pale yellow. °Take over-the-counter or prescription medicines. °Eat foods that are high in fiber, such as beans, whole grains, and fresh fruits and vegetables. °Limit foods that are high in fat and processed sugars, such as fried or sweet foods. °Incision care ° °Follow instructions from your health care provider about how to take care of your incisions. Make sure you: °Wash your hands with soap and water for at least 20 seconds before and after you change your bandage (dressing). If soap and water are not available, use hand sanitizer. °Change your dressing as told by your health care provider. °Leave stitches (sutures), skin glue, or adhesive strips in place. These skin closures may need to stay in place for 2 weeks or longer. If adhesive strip  edges start to loosen and curl up, you may trim the loose edges. Do not remove adhesive strips completely unless your health care provider tells you to do that. °Check your incision areas every day for signs of infection. Check for: °More redness, swelling, or pain. °Fluid or blood. °Warmth. °Pus or a bad smell. °Do not take baths, swim, or use a hot tub until your health care provider approves. Ask your health care provider if you may take showers. You may only be allowed to take sponge baths. °Activity °Rest as told by your health care provider. °Avoid sitting for a long time without moving. Get up to take short walks every 1-2 hours. This is important to improve blood flow and breathing. Ask for help if you feel weak or unsteady. °Do not lift anything that is heavier than 10 lb (4.5 kg), or the limit that you are told, until your health care provider says that it is safe. °Return to your normal activities and diet as told by your health care provider. Ask your health care provider what activities are safe for you. °General instructions °Do not douche, use tampons, or have sex until your health care provider says it is okay. °Do not use any products that contain nicotine or tobacco, such as cigarettes, e-cigarettes, and chewing tobacco. These can delay incision healing after surgery. If you need help quitting, ask your health care provider. °Keep all follow-up visits. This is important. °Contact a health care provider if: °You have a fever or chills. °You feel nauseous or you vomit. °You have pain when you urinate or have blood in   your urine. °You have a rash on your body. °You have pain or redness where the IV was inserted. °You have pain that is not relieved with medicine. °You have any of these signs of infection: °More redness, swelling, or pain around an incision. °Fluid or blood coming from an incision. °Warmth coming from an incision. °Pus or a bad smell coming from an incision. °Get help right away  if: °You have chest pain or shortness of breath. °You feel dizzy or light-headed. °You have heavy bleeding. °You have increasing abdominal pain that is not relieved with medicine. °You have pain, swelling, or redness in your leg. °Your incision is opening and the edges are not staying together. °Summary °After the procedure, it is common to have some pain in your abdomen. You will be given pain medicine to control the pain. °Follow instructions from your health care provider about how to take care of your incisions. °Do not douche, use tampons, or have sex until your health care provider says it okay. °Keep all follow-up visits. This is important. °This information is not intended to replace advice given to you by your health care provider. Make sure you discuss any questions you have with your health care provider. °Document Revised: 06/08/2019 Document Reviewed: 06/08/2019 °Elsevier Patient Education © 2022 [employer] ° °

## 2020-11-18 NOTE — OR Nursing (Signed)
Patient self tested surgilube on pinky finger due to allergies.  Per patient okay to use surgilube.

## 2020-11-18 NOTE — Progress Notes (Signed)
Upreg had not transferred from earlier - Joretta Bachelor, RN confirmed Upreg was negative.

## 2020-11-18 NOTE — Anesthesia Procedure Notes (Signed)
Procedure Name: Intubation Date/Time: 11/18/2020 6:47 PM Performed by: Joanette Gula, Esmeralda Malay, CRNA Pre-anesthesia Checklist: Patient identified, Emergency Drugs available, Suction available and Patient being monitored Patient Re-evaluated:Patient Re-evaluated prior to induction Oxygen Delivery Method: Circle system utilized Preoxygenation: Pre-oxygenation with 100% oxygen Induction Type: IV induction and Rapid sequence Laryngoscope Size: McGraph and 3 Grade View: Grade I Tube type: Oral Tube size: 7.0 mm Number of attempts: 1 Airway Equipment and Method: Stylet Placement Confirmation: ETT inserted through vocal cords under direct vision, positive ETCO2 and breath sounds checked- equal and bilateral Secured at: 21 cm Tube secured with: Tape Dental Injury: Teeth and Oropharynx as per pre-operative assessment

## 2020-11-18 NOTE — Transfer of Care (Signed)
Immediate Anesthesia Transfer of Care Note  Patient: Lisa Sharp  Procedure(s) Performed: XI ROBOTIC ASSISTED LAPAROSCOPIC LYSIS OF ADHESIONS (Right)  Patient Location: PACU  Anesthesia Type:General  Level of Consciousness: drowsy  Airway & Oxygen Therapy: Patient Spontanous Breathing and Patient connected to face mask oxygen  Post-op Assessment: Report given to RN  Post vital signs: stable  Last Vitals:  Vitals Value Taken Time  BP 164/96 11/18/20 2018  Temp    Pulse 94 11/18/20 2022  Resp 16 11/18/20 2022  SpO2 95 % 11/18/20 2022  Vitals shown include unvalidated device data.  Last Pain:  Vitals:   11/18/20 1603  TempSrc: Oral  PainSc: 8          Complications: No notable events documented.

## 2020-11-18 NOTE — Discharge Instructions (Signed)

## 2020-11-18 NOTE — Interval H&P Note (Signed)
History and Physical Interval Note:  11/18/2020 5:45 PM  Lisa Sharp  has presented today for surgery, with the diagnosis of right ovarian hemorrhagic cyst.  The various methods of treatment have been discussed with the patient and family. After consideration of risks, benefits and other options for treatment, the patient has consented to  Procedure(s): XI ROBOTIC ASSISTED LAPAROSCOPIC OVARIAN CYSTECTOMY (Right) possible need for bilateral salpingectomy or at least right salpingectomy, as a surgical intervention.  The patient's history has been reviewed, patient examined, no change in status, stable for surgery.  I have reviewed the patient's chart and labs.  Questions were answered to the patient's satisfaction.     Thomasene Mohair, MD, [facility], [facility] 11/18/2020 5:46 PM

## 2020-11-18 NOTE — Op Note (Addendum)
Operative Note    Name: Lisa Sharp  Date of Service: 11/18/2020  DOB: 1980-06-29  MRN: [id number]   Pre-Operative Diagnosis:  1) Right lower quadrant abdominal pain [R10.31] 2) Hemorrhagic ovarian cyst [N83.209]  Post-Operative Diagnosis:  1) Right lower quadrant abdominal pain [R10.31] 2) Small follicles on right ovary and ~3 cm right ovarian cyst  Procedures:  1) Robot assisted laparoscopic lysis of adhesions 2) right ovarian cystectomy  Primary Surgeon: Thomasene Mohair, MD   EBL: 25 mL   IVF: 1,200 mL   Urine output: 150 mL  Specimens: none  Drains: none  Complications: None   Disposition: PACU   Condition: Stable   Findings:  1) normal appearing uterus, left ovary and fallopian tube (with changes consistent with tubal ligation, clip in place). Right fallopian tubes with changes consistent with tubal ligation (disruption of fallopian tube with no clip in place).   2) adhesions of right ovary to right uterosacral ligament and right pelvic sidewall  Procedure Summary:  The patient was taken to the operating room where general anesthesia was administered and found to be adequate. She was placed in the dorsal supine lithotomy position in [place] stirrups and prepped and draped in usual sterile fashion. After a timeout was called an indwelling catheter was placed in her bladder.  A sterile speculum was placed in the vagina and a single tooth tenaculum was affixed to the anterior lip of the cervix. The ZUMI uterine manipulator was affixed to the cervix in accordance with the recommendations of the manufacturer.    Attention was turned to the abdomen where after injection of local anesthetic, an 8 mm supraumbilical incision was made with the scalpel. Entry into the abdomen was obtained via Optiview trocar technique (a blunt entry technique with camera visualization through the obturator upon entry). Verification of entry into the abdomen was obtained using opening  pressures. The abdomen was insufflated with CO2. The camera was introduced through the trocar with verification of atraumatic entry.  Right and left abdominal entry sites were created after injection of local anesthetic about 8 cm away from the umbilical port in accordance with the Intuitive manufacturer's recommendations.  The port sites were 8 mm.  The intuitive trochars were introduced under intra-abdominal camera visualization without difficulty.  The robot was docked from the patient's left.  Arms 2 3 and 4 were utilized.  An 8 mm, 30 degree camera was used in arm 3.  In arm 2 forced bipolar forceps were introduced.  In arm 4 curved monopolar scissors were introduced.  The instruments were advanced under direct visualization.  Attention was turned to the pelvis where the right ovary was visualized.  The ovary was mildly enlarged with several small follicles noted and 1 small cyst (~3 cm).  All were opened and drained of clear fluid. Given the larger size of the cyst, the cyst wall was removed as much as possible with a small amount of cautery necessary to obtain hemostasis. There was no evidence of torsion.  The ovary was attached to the right pelvic sidewall and right uterosacral ligament with moderate adhesions.  These were cauterized and transected.  Attention was turned to the left ovary and fallopian tube.  The ovary appeared normal.  Both fallopian tubes appeared consistent with her history of reported tubal ligation.  The left fallopian tube did have a Filshie clip.  The right fallopian tube did not have an Filshie clip.  However it was found to be transected and the free ends were  well apart and appeared to be scarred over.  No other source for the patient's pain was found.  Hemostasis was obtained and verified.  This terminated the procedure.  The instruments were removed.  The robot was undocked.  The abdomen was desufflated of CO2.  The trochars were removed.  The skin incisions were closed using  4-0 Monocryl.  The skin was reinforced with surgical skin glue.  Attention was returned to the pelvis and the ZUMI uterine manipulator was removed.  The tenaculum entry sites were hemostatic.  However, silver nitrate was applied to ensure ongoing hemostasis.  The Foley catheter was removed.  The patient tolerated the procedure well.  Sponge, lap, needle, and instrument counts were correct x 2.  VTE prophylaxis: SCDs. Antibiotic prophylaxis: none indicated and none given. She was awakened in the operating room and was taken to the PACU in stable condition.   Thomasene Mohair, MD 11/18/2020 8:27 PM

## 2020-11-18 NOTE — Anesthesia Preprocedure Evaluation (Addendum)
Anesthesia Evaluation  Patient identified by MRN, date of birth, ID band Patient awake    Reviewed: Allergy & Precautions, H&P , NPO status , Patient's Chart, lab work & pertinent test results  History of Anesthesia Complications (+) PONV, Family history of anesthesia reaction and history of anesthetic complications  Airway Mallampati: II  TM Distance: >3 FB Neck ROM: full    Dental  (+) Chipped, Poor Dentition, Missing   Pulmonary neg shortness of breath, asthma , former smoker,    Pulmonary exam normal        Cardiovascular Exercise Tolerance: Poor (-) angina(-) Past MI Normal cardiovascular exam  Limited METS due to knee pain   Neuro/Psych  Headaches,  Neuromuscular disease negative psych ROS   GI/Hepatic Neg liver ROS, GERD  Medicated and Controlled,complaining of right lower quadrant abdominal pain for the past 10 days with nausea and vomiting today   Endo/Other  negative endocrine ROS  Renal/GU      Musculoskeletal  (+) Arthritis ,   Abdominal (+) + obese,   Peds  Hematology   Anesthesia Other Findings right ovarian hemorrhagic cyst  Chronic pain in legs and back  Past Medical History: No date: Anemia No date: Arthritis     Comment:  left knee and hips No date: Asthma 1992: Chronic bronchitis (HCC) No date: Complication of anesthesia     Comment:  severe nausea and vomiting No date: Dyspnea No date: Family history of adverse reaction to anesthesia     Comment:  severe nausea and vomiting No date: GERD (gastroesophageal reflux disease) 02/17/2003: Guillain-Barre disease (HCC)     Comment:  B leg weakness No date: Headache     Comment:  migraines 1-2 per month No date: Insomnia No date: Orthostatic hypotension No date: PONV (postoperative nausea and vomiting)     Comment:  pain pills given after surgery makes her sick, she               voices "all" pain pills makes her sick 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 No date: RLS (restless legs syndrome)  Past Surgical History: 2009: CHOLECYSTECTOMY No date: COLONOSCOPY No date: FINGER FRACTURE SURGERY 06/12/2014: GANGLION CYST EXCISION; Left     Comment:  Procedure: REMOVAL GANGLION OF WRIST;  Surgeon: Kennedy Bucker, MD;  Location: [facility];  Service: Orthopedics;                Laterality: Left; 2005: KNEE ARTHROSCOPY; Left No date: left knee arthrscopy 08/17/2014: TONSILLECTOMY AND ADENOIDECTOMY; Bilateral     Comment:  Procedure: TONSILLECTOMY AND ADENOIDECTOMY;  Surgeon:               Linus Salmons, MD;  Location: [facility];                Service: ENT;  Laterality: Bilateral; 2010: TUBAL LIGATION; Bilateral  BMI    Body Mass Index: 46.90 kg/m      Reproductive/Obstetrics negative OB ROS                            Anesthesia Physical  Anesthesia Plan  ASA: III  Anesthesia Plan: General ETT   Post-op Pain Management: GA combined w/ Regional for post-op pain   Induction: Intravenous  PONV Risk Score and Plan: Ondansetron, Dexamethasone, Midazolam, Treatment may vary due to age or medical condition, Propofol infusion, TIVA and  Aprepitant  Airway Management Planned: Oral ETT  Additional Equipment:   Intra-op Plan:   Post-operative Plan: Extubation in OR  Informed Consent: I have reviewed the patients History and Physical, chart, labs and discussed the procedure including the risks, benefits and alternatives for the proposed anesthesia with the patient or authorized representative who has indicated his/her understanding and acceptance.     Dental Advisory Given and Dental advisory given  Plan Discussed with: Anesthesiologist, CRNA and Surgeon  Anesthesia Plan Comments: (  Patient consented for risks of anesthesia including but not limited to:  - adverse reactions to medications - damage to eyes, teeth, lips or other oral mucosa - nerve damage due to  positioning  - sore throat or hoarseness - Damage to heart, brain, nerves, lungs, other parts of body or loss of life  Patient voiced understanding.)       Anesthesia Quick Evaluation

## 2020-11-18 NOTE — Progress Notes (Signed)
Patient ID: Lisa Sharp, female   DOB: 10-Jan-1981, 40 y.o.   MRN: [id number]  Reason for Consult: Follow-up   Referred by [facility]  Subjective:     HPI:  Lisa Sharp is a 40 y.o. female she presents today to follow-up regarding a right ovarian cyst.  She reports that 2 weeks ago she had a sudden onset of right-sided pain.  She initially thought that this was gas pain since that became the last but it has been feeling sharp and stabbing recently.  She reports that she was seen in the [facility] ER and was diagnosed with a right hemorrhagic cyst.  She has had several hemorrhagic cyst before but this pain feels more severe and like it is getting worse.  She was seen recently in the [facility] ER and had a CT which did not show evidence of appendicitis or other acute acute cause of her abdominal pain.  She was prescribed opiate pain medicine which she has not received some relief from but not significant improvement.  She reports that recently she has not been able to eat.  Reports that she is having nausea and vomiting and has not been able to keep anything down since Saturday.  She is not sleeping well and that she is urinating on herself multiple times a day because of the pain.  She is visibly shaking from pain in the office she is tearful.  She is requesting a garbage bag which she reports she has been vomiting and 2.  Pelvic ultrasound and [facility] ER showed normal flow to the right ovary.  Gynecological History  Patient's last menstrual period was 11/12/2020 (exact date).  History of fibroids, polyps, or ovarian cysts? : history of ovarian cysts twice before  History of PCOS? no Hstory of Endometriosis? no History of abnormal pap smears? Yes in 2009 Have you had any sexually transmitted infections in the past? no  Last Pap: Results were: 11/02/2018 NIL and HR HPV negative    She identifies as a female. She is sexually active with men.   She denies dyspareunia. She denies  postcoital bleeding.  She currently uses tubal ligation for contraception.   Obstetrical History OB History  Gravida Para Term Preterm AB Living  3 2 1 1 1 2   SAB IAB Ectopic Multiple Live Births  1       2    # Outcome Date GA Lbr Len/2nd Weight Sex Delivery Anes PTL Lv  3 SAB           2 Preterm           1 Term              Past Medical History:  Diagnosis Date   Anemia    Arthritis    left knee and hips   Asthma    Chronic bronchitis (HCC) 0000000   Complication of anesthesia    severe nausea and vomiting   Dyspnea    Family history of adverse reaction to anesthesia    severe nausea and vomiting   GERD (gastroesophageal reflux disease)    Guillain-Barre disease ([place]) 02/17/2003   B leg weakness   Headache    migraines 1-2 per month   Insomnia    Orthostatic hypotension    PONV (postoperative nausea and vomiting)    pain pills given after surgery makes her sick, she voices "all" pain pills makes her sick   Pseudopapilledema of both optic discs 1995  RLS (restless legs syndrome)    Family History  Problem Relation Age of Onset   Fibroids Mother    Fibroids Maternal Grandmother    Uterine cancer Maternal Grandmother    Vaginal cancer Paternal Grandmother    Past Surgical History:  Procedure Laterality Date   CHOLECYSTECTOMY  2009   COLONOSCOPY     FINGER FRACTURE SURGERY     GANGLION CYST EXCISION Left 06/12/2014   Procedure: REMOVAL GANGLION OF WRIST;  Surgeon: Hessie Knows, MD;  Location: [facility];  Service: Orthopedics;  Laterality: Left;   KNEE ARTHROSCOPY Left 2005   KNEE ARTHROSCOPY WITH PATELLA RECONSTRUCTION Left 12/25/2019   Procedure: Left knee arthroscopy, partial lateral meniscectomy vs lateral meniscus repair, MPFL reconstruction using semitendinosus allograft, and osteochondral allograft to the patella, possible lateral release - Reche Dixon to Assist;  Surgeon: Leim Fabry, MD;  Location: [facility];  Service: Orthopedics;  Laterality: Left;   left  knee arthrscopy     TONSILLECTOMY AND ADENOIDECTOMY Bilateral 08/17/2014   Procedure: TONSILLECTOMY AND ADENOIDECTOMY;  Surgeon: Beverly Gust, MD;  Location: [place];  Service: ENT;  Laterality: Bilateral;   TUBAL LIGATION Bilateral 2010    Short Social History:  Social History   Tobacco Use   Smoking status: Former    Packs/day: 1.00    Years: 5.00    Pack years: 5.00    Types: Cigarettes   Smokeless tobacco: Never  Substance Use Topics   Alcohol use: Not Currently    Alcohol/week: 1.0 standard drink    Types: 1 Glasses of wine per week    Allergies  Allergen Reactions   Aspirin Anaphylaxis   Petroleum Jelly [Skin Protectants, Misc.] Other (See Comments)    Causes skin burn   Scopolamine     Hx guillain barre syndrome and pt states she is not able to have scopolamine patch.   Verapamil Hcl Er Other (See Comments)    Extreme muscle pain    Current Outpatient Medications  Medication Sig Dispense Refill   acetaminophen (TYLENOL) 500 MG tablet Take 2 tablets (1,000 mg total) by mouth every 8 (eight) hours. 90 tablet 2   albuterol (PROVENTIL HFA;VENTOLIN HFA) 108 (90 Base) MCG/ACT inhaler Inhale 2 puffs into the lungs every 6 (six) hours as needed for wheezing or shortness of breath. 1 Inhaler 0   albuterol (PROVENTIL) (2.5 MG/3ML) 0.083% nebulizer solution Take 3 mLs (2.5 mg total) by nebulization every 4 (four) hours as needed for wheezing or shortness of breath. 75 mL 0   benzonatate (TESSALON) 100 MG capsule Take 100 mg by mouth 3 (three) times daily as needed for cough.      butalbital-acetaminophen-caffeine (FIORICET) 50-325-40 MG tablet Take 1 tablet by mouth every 6 (six) hours as needed for migraine.      cetirizine (ZYRTEC) 10 MG tablet Take 10 mg by mouth daily.     famotidine (PEPCID) 20 MG tablet Take 20 mg by mouth 2 (two) times daily.     ferrous sulfate 325 (65 FE) MG tablet Take 325 mg by mouth daily with breakfast.     fluticasone (FLONASE) 50  MCG/ACT nasal spray Place 1 spray into both nostrils daily.     Fluticasone-Salmeterol (ADVAIR) 250-50 MCG/DOSE AEPB Inhale 1 puff into the lungs 2 (two) times daily.     ipratropium (ATROVENT) 0.02 % nebulizer solution Take 0.5 mg by nebulization every 6 (six) hours as needed for wheezing or shortness of breath.     montelukast (SINGULAIR) 10 MG tablet  Take 10 mg by mouth at bedtime.     ondansetron (ZOFRAN ODT) 4 MG disintegrating tablet Take 1 tablet (4 mg total) by mouth every 8 (eight) hours as needed for nausea or vomiting. 20 tablet 0   oxybutynin (DITROPAN) 5 MG tablet Take 5 mg by mouth in the morning and at bedtime.     oxyCODONE-acetaminophen (PERCOCET) 5-325 MG tablet Take 1-2 tablets by mouth every 6 (six) hours as needed for up to 5 days for severe pain. 20 tablet 0   No current facility-administered medications for this visit.    Review of Systems  Constitutional: Negative for chills, fatigue, fever and unexpected weight change.  HENT: Negative for trouble swallowing.  Eyes: Negative for loss of vision.  Respiratory: Negative for cough, shortness of breath and wheezing.  Cardiovascular: Negative for chest pain, leg swelling, palpitations and syncope.  GI: Negative for abdominal pain, blood in stool, diarrhea, nausea and vomiting.  GU: Negative for difficulty urinating, dysuria, frequency and hematuria.  Musculoskeletal: Negative for back pain, leg pain and joint pain.  Skin: Negative for rash.  Neurological: Negative for dizziness, headaches, light-headedness, numbness and seizures.  Psychiatric: Negative for behavioral problem, confusion, depressed mood and sleep disturbance.       Objective:  Objective   Vitals:   11/18/20 1106  BP: 124/72  Weight: 282 lb (127.9 kg)  Height: 5\' 6"  (1.676 m)   Body mass index is 45.52 kg/m.  Physical Exam Vitals and nursing note reviewed. Exam conducted with a chaperone present.  Constitutional:      Appearance: Normal  appearance. She is well-developed.  HENT:     Head: Normocephalic and atraumatic.  Eyes:     Extraocular Movements: Extraocular movements intact.     Pupils: Pupils are equal, round, and reactive to light.  Cardiovascular:     Rate and Rhythm: Normal rate and regular rhythm.  Pulmonary:     Effort: Pulmonary effort is normal. No respiratory distress.     Breath sounds: Normal breath sounds.  Abdominal:     General: Abdomen is flat.     Palpations: Abdomen is soft.  Genitourinary:    Comments: External: Normal appearing vulva. No lesions noted.   Bimanual examination: Uterus midline, tender, normal in size, shape and contour.  No CMT. No left adnexal masses or tenderness. + RIGHT adnexal tenderness and enlargement. Pelvis not fixed.  Breast exam: exam not performed Musculoskeletal:        General: No signs of injury.  Skin:    General: Skin is warm and dry.  Neurological:     Mental Status: She is alert and oriented to person, place, and time.  Psychiatric:        Behavior: Behavior normal.        Thought Content: Thought content normal.        Judgment: Judgment normal.    Assessment/Plan:     40 year old with severe right-sided abdominal pain.  Likely secondary to hemorrhagic ovarian cyst, possible ovarian torsion  Given the patient's severe pain discussed with Dr. [id number] the physician on-call.  He is willing to take the patient for emergency surgery later this evening.  Patient understands that she should remain NPO.  We discussed the risk of the surgery including risk of infection risk of damage to surrounding pelvic tissues and risk of bleeding.  Patient understands these risks and was willing to have surgery.  Discussed robotic assisted laparoscopic right ovarian cystectomy with bilateral salpingectomy because the patient has had  a previous tubal ligation and does not pregnancy.  Her CT scan has showed a displaced tubal clip.  We also discussed the possibility of a  right oophorectomy if torsion is present in the ovary is necrotic.  Presciptions for postoperative pain medications sent to her pharmacy.  More than 30 minutes were spent face to face with the patient in the room, reviewing the medical record, labs and images, and coordinating care for the patient. The plan of management was discussed in detail and counseling was provided.     Adrian Prows MD [facility] 11/18/2020 11:37 AM

## 2020-11-19 ENCOUNTER — Encounter: Payer: Self-pay | Admitting: Obstetrics and Gynecology

## 2020-11-20 LAB — POCT PREGNANCY, URINE: Preg Test, Ur: NEGATIVE

## 2020-11-20 NOTE — Anesthesia Postprocedure Evaluation (Signed)
Anesthesia Post Note  Patient: Lisa Sharp  Procedure(s) Performed: XI ROBOTIC ASSISTED LAPAROSCOPIC LYSIS OF ADHESIONS (Right)  Patient location during evaluation: PACU Anesthesia Type: General Level of consciousness: awake and alert Pain management: pain level controlled Vital Signs Assessment: post-procedure vital signs reviewed and stable Respiratory status: spontaneous breathing, nonlabored ventilation and respiratory function stable Cardiovascular status: blood pressure returned to baseline and stable Postop Assessment: no apparent nausea or vomiting Anesthetic complications: no   No notable events documented.   Last Vitals:  Vitals:   11/18/20 2100 11/18/20 2121  BP: 132/70 130/83  Pulse: 97 79  Resp: 18 20  Temp: (!) 36.4 C 36.5 C  SpO2: 97% 98%    Last Pain:  Vitals:   11/18/20 2121  TempSrc: Temporal  PainSc: 4                  Foye Deer

## 2020-11-26 ENCOUNTER — Other Ambulatory Visit: Payer: Self-pay

## 2020-11-26 ENCOUNTER — Ambulatory Visit (INDEPENDENT_AMBULATORY_CARE_PROVIDER_SITE_OTHER): Payer: Medicaid Other | Admitting: Obstetrics and Gynecology

## 2020-11-26 ENCOUNTER — Encounter: Payer: Self-pay | Admitting: Obstetrics and Gynecology

## 2020-11-26 VITALS — BP 126/84 | Ht 66.5 in

## 2020-11-26 DIAGNOSIS — Z09 Encounter for follow-up examination after completed treatment for conditions other than malignant neoplasm: Secondary | ICD-10-CM

## 2020-11-26 DIAGNOSIS — Z30013 Encounter for initial prescription of injectable contraceptive: Secondary | ICD-10-CM

## 2020-11-26 MED ORDER — MEDROXYPROGESTERONE ACETATE 150 MG/ML IM SUSP
150.0000 mg | INTRAMUSCULAR | 3 refills | Status: AC
Start: 2020-11-26 — End: 2021-02-24

## 2020-11-26 NOTE — Progress Notes (Signed)
   Postoperative Follow-up Patient presents post op from robot assisted right ovarian cystectomy, lysis of adhesions 1 weeks ago for right lower quadrant pain, right ovarian cyst.  Subjective: Patient reports marked improvement in her preop symptoms. Eating a regular diet without difficulty. The patient is not having any pain.  Activity: normal activities of daily living.  She denies fever, chills, nausea, and vomiting.   Objective: Vital Signs: BP 126/84   Ht 5' 6.5" (1.689 m)   LMP 11/12/2020 (Exact Date)   BMI 46.11 kg/m  Physical Exam Constitutional:      General: She is not in acute distress.    Appearance: Normal appearance.  HENT:     Head: Normocephalic and atraumatic.  Eyes:     General: No scleral icterus.    Conjunctiva/sclera: Conjunctivae normal.  Abdominal:     General: There is no distension.     Palpations: Abdomen is soft.     Tenderness: There is no abdominal tenderness. There is no guarding or rebound.     Comments: Incisions: without erythema, induration, warmth, and tenderness. They are clean, dry, and intact.   Neurological:     General: No focal deficit present.     Mental Status: She is alert and oriented to person, place, and time.     Cranial Nerves: No cranial nerve deficit.  Psychiatric:        Mood and Affect: Mood normal.        Behavior: Behavior normal.        Judgment: Judgment normal.     Assessment: 40 y.o. s/p above surgery progressing well  Plan: Patient has done well after surgery with no apparent complications.  I have discussed the post-operative course to date, and the expected progress moving forward.  The patient understands what complications to be concerned about.  I will see the patient in routine follow up, or sooner if needed.    Activity plan: increase slowly  Thomasene Mohair, MD  11/26/2020, 11:48 AM

## 2020-12-25 ENCOUNTER — Encounter: Payer: Self-pay | Admitting: Obstetrics and Gynecology

## 2022-02-26 IMAGING — CR DG BONE LENGTH
1 series · 5 of 5 positions shown · non-contrast
Comparison: None.

CLINICAL DATA: Can alignment.  Knee surgery [REDACTED].

EXAM:
BONE LENGTH

[Series 2: long bone ap · 0.14mm/px · 5 of 5 slices shown]
[im 1/5]
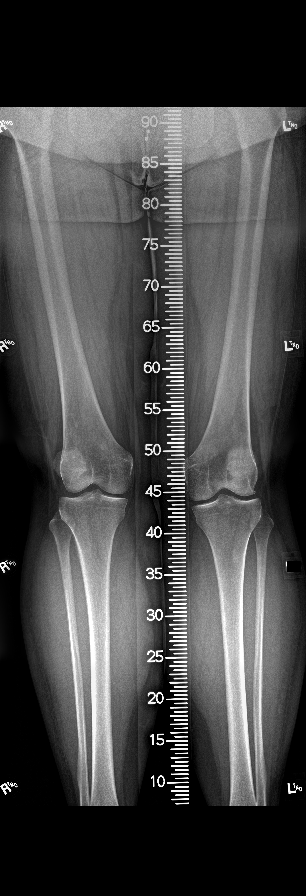
[im 2/5]
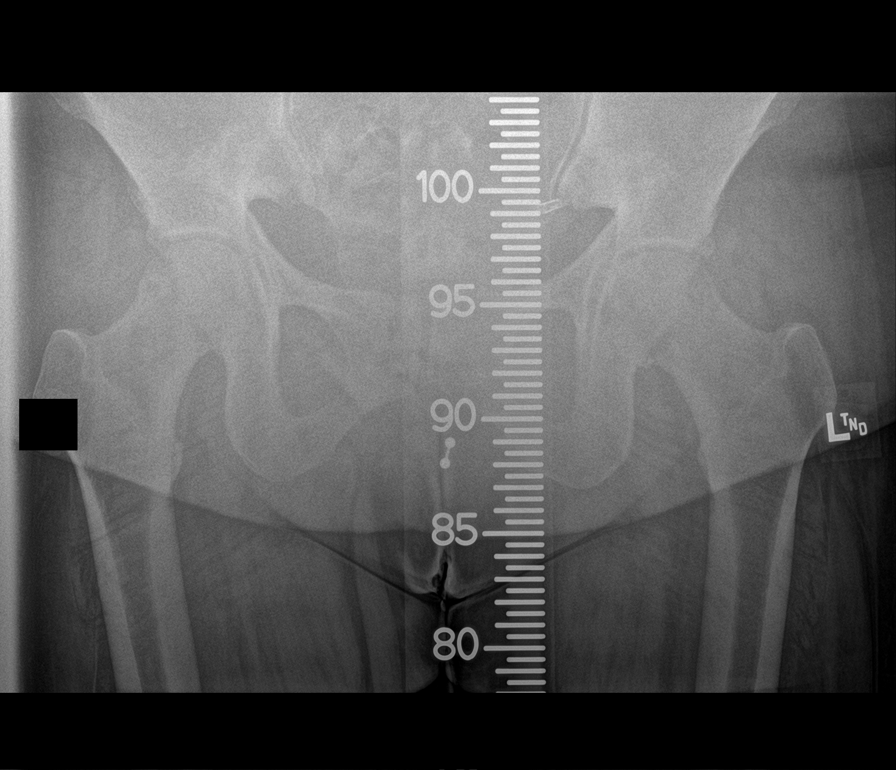
[im 3/5]
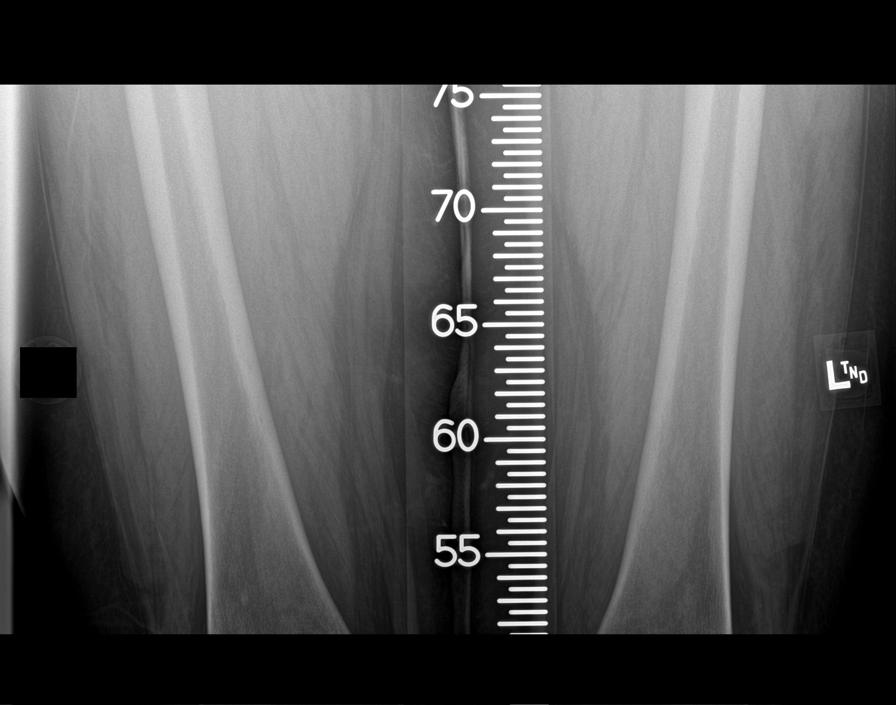
[im 4/5]
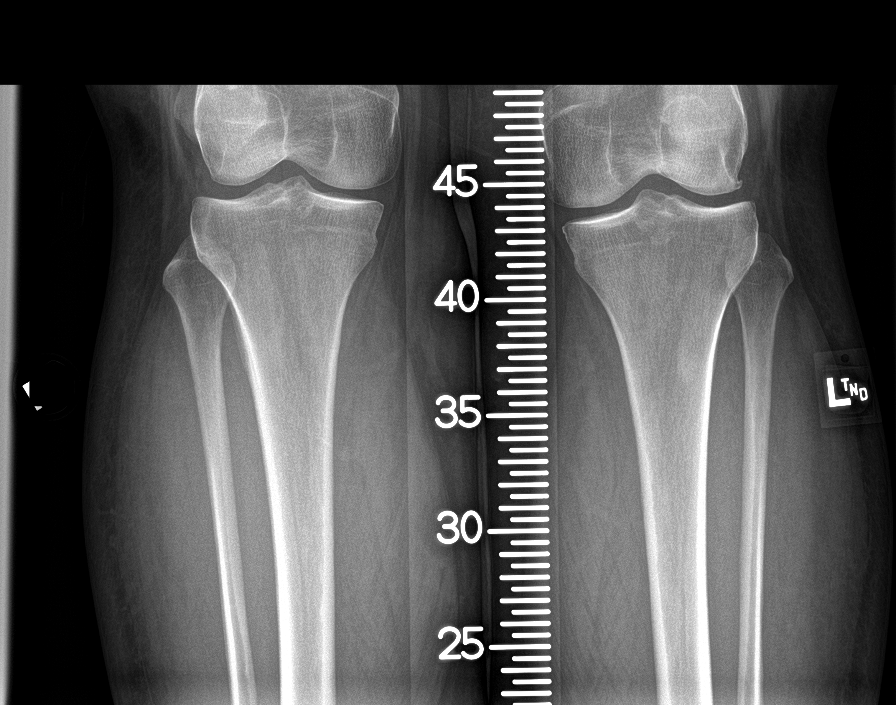
[im 5/5]
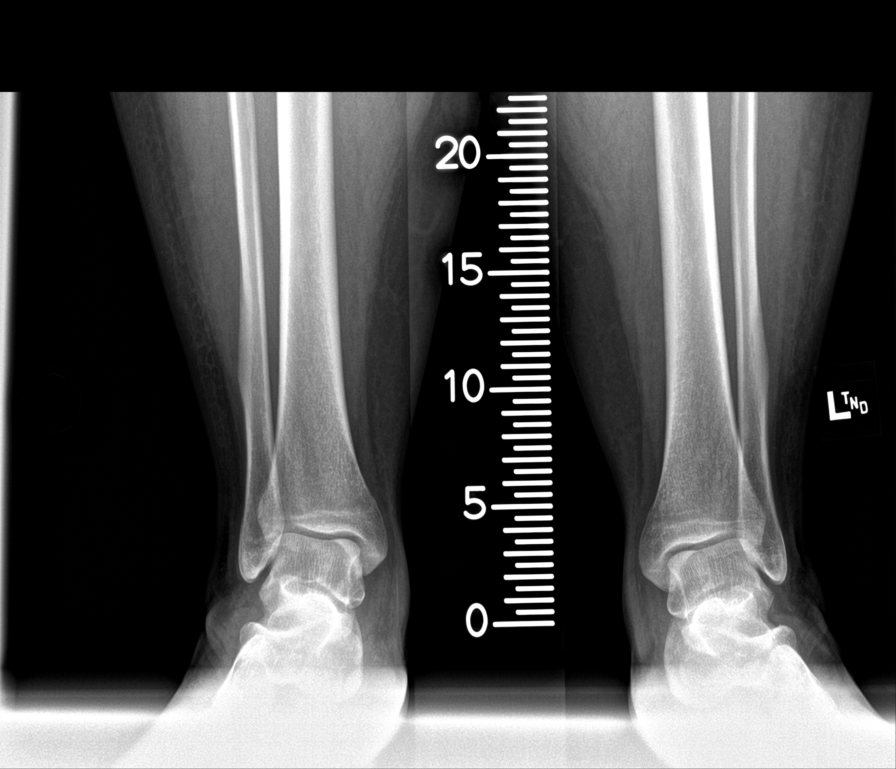

[5 of 5 positions shown; findings below may reference images not displayed]

FINDINGS: Femurs are symmetric in size. The right femur and right tibia are
1-2 mm longer than the left femur and tibia. No significant
angulation differences present.
IMPRESSION: Slight leg length discrepancy. Right femur and tibia are slightly
longer than the left.

## 2023-01-18 IMAGING — US US PELVIS COMPLETE TRANSABD/TRANSVAG W DUPLEX
1 series · 13 of 25 positions shown · non-contrast
Comparison: None.

CLINICAL DATA: Severe right lower quadrant pain x1 week

EXAM:
TRANSABDOMINAL AND TRANSVAGINAL ULTRASOUND OF PELVIS
DOPPLER ULTRASOUND OF OVARIES
TECHNIQUE: Both transabdominal and transvaginal ultrasound examinations of the
pelvis were performed. Transabdominal technique was performed for
global imaging of the pelvis including uterus, ovaries, adnexal
regions, and pelvic cul-de-sac.
It was necessary to proceed with endovaginal exam following the
transabdominal exam to visualize the endometrium and bilateral
ovaries. Color and duplex Doppler ultrasound was utilized to
evaluate blood flow to the ovaries.

[Series 1: us pelvic complete w transvaginal and torsion righ · 128 acquisitions, 13 frames shown]
[im 1/128]
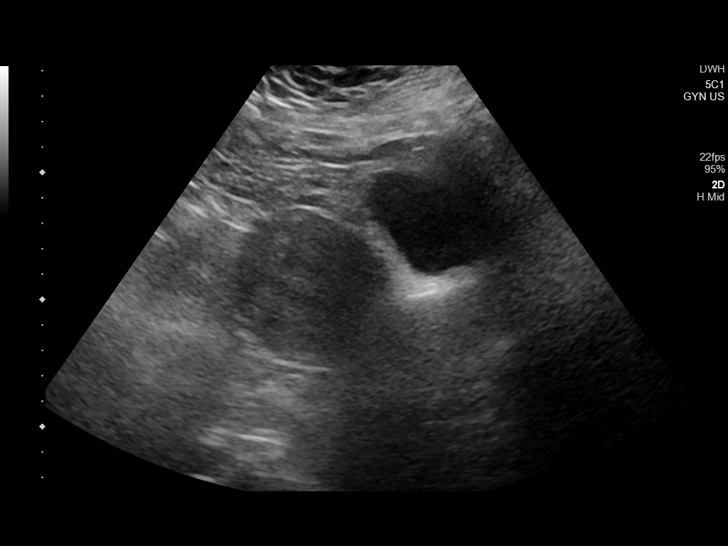
[im 11/128]
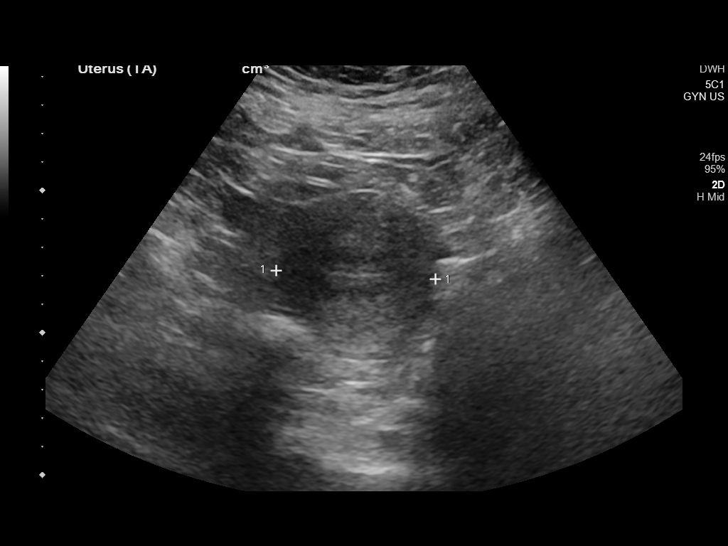
[im 22/128]
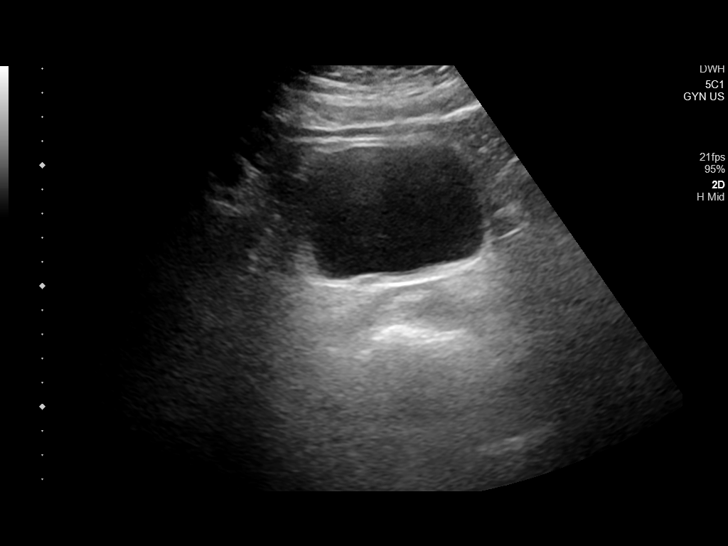
[im 32/128]
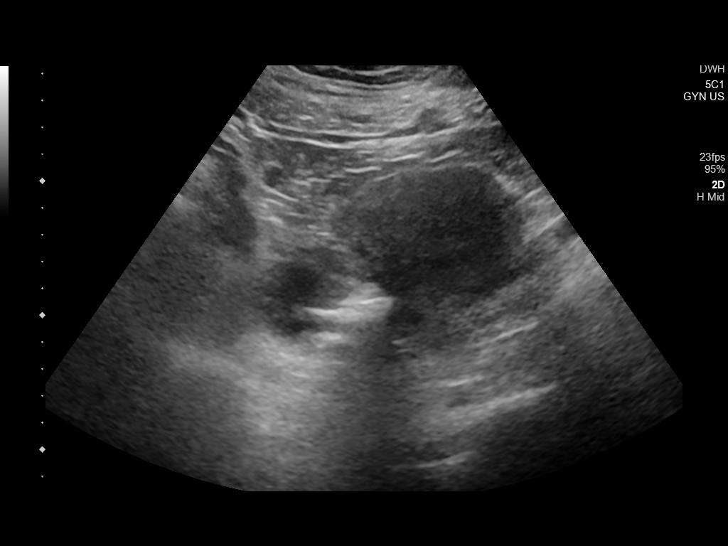
[im 43/128]
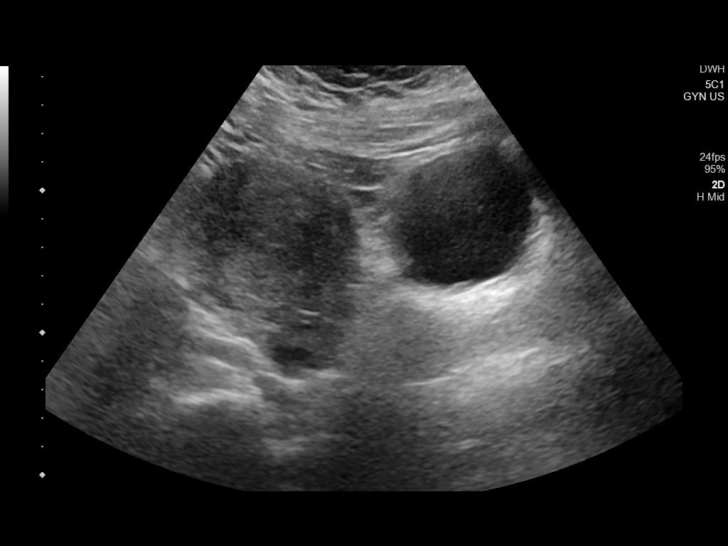
[im 53/128]
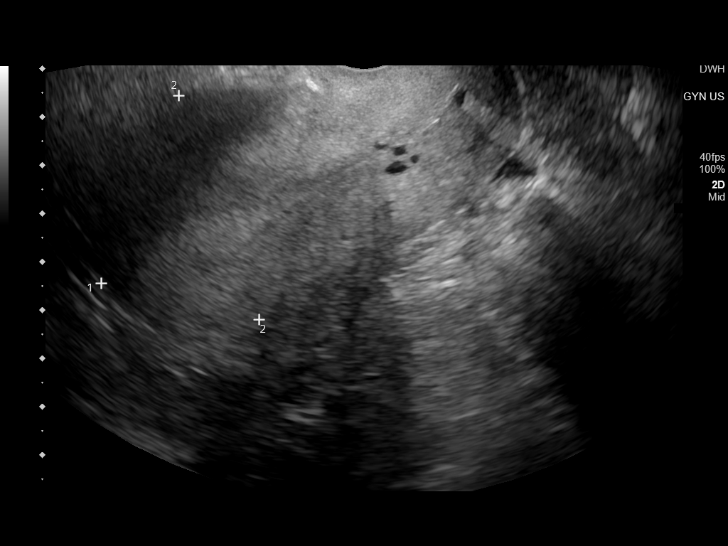
[im 64/128]
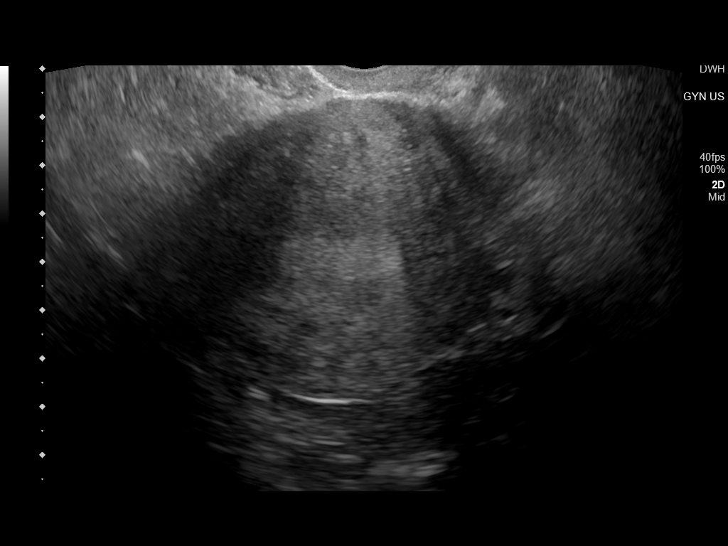
[im 75/128]
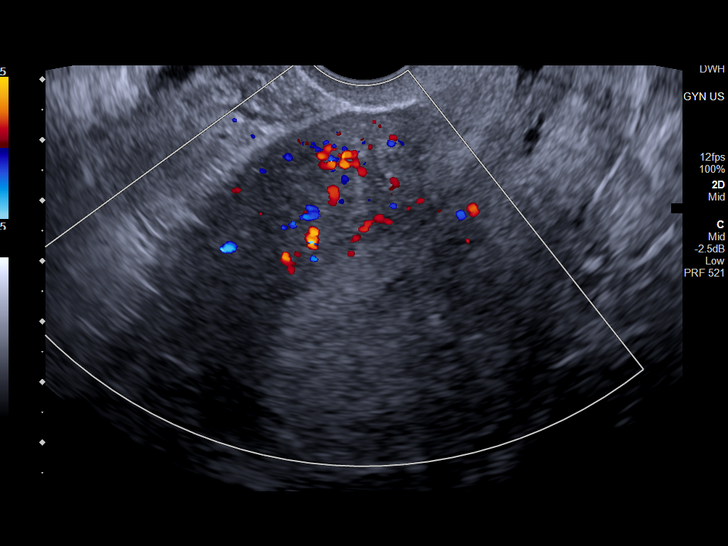
[im 85/128]
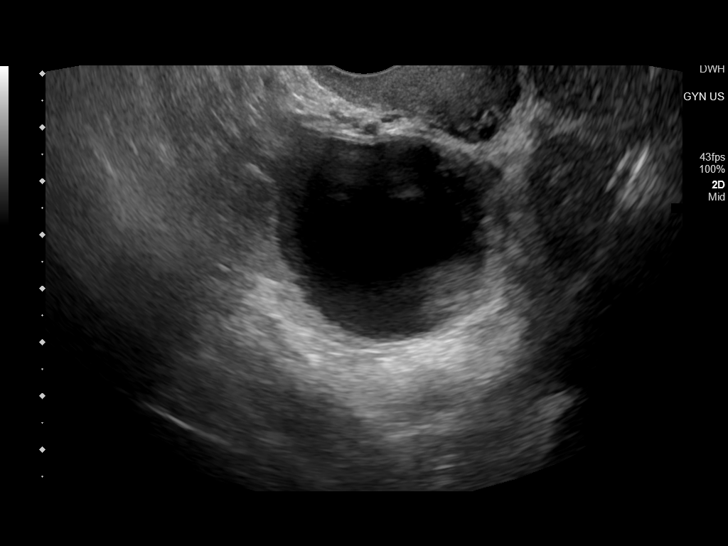
[im 96/128]
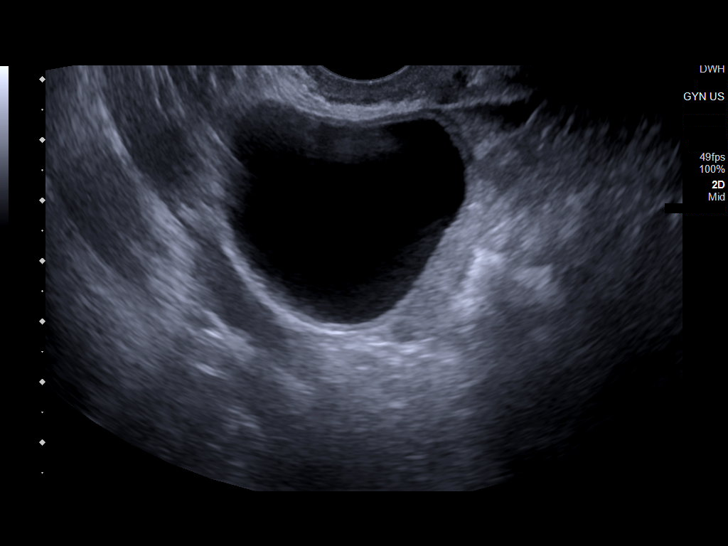
[im 106/128]
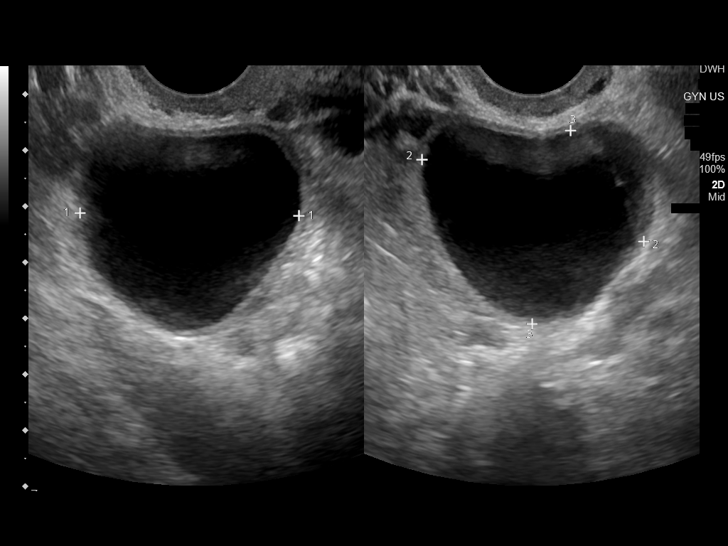
[im 117/128]
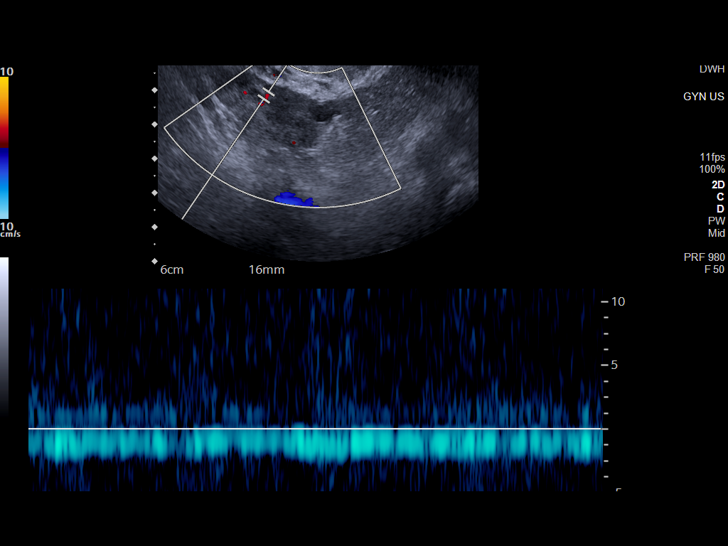
[im 128/128]
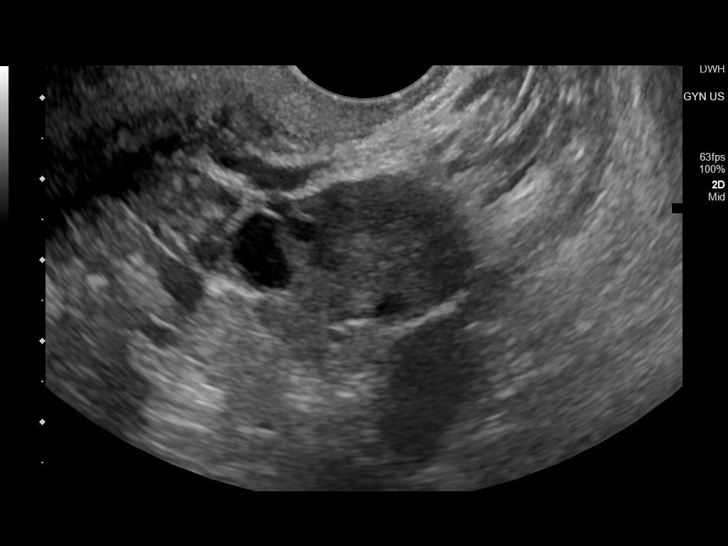

[13 of 25 positions shown; findings below may reference images not displayed]

FINDINGS: Uterus

Measurements: 9.6 x 4.9 x 6.4 cm = volume: 159 mL. No fibroids or
other mass visualized.

Endometrium

Thickness: 8 mm.  No focal abnormality visualized.

Right ovary

Measurements: 6.1 x 3.9 x 4.5 cm = volume: 56 mL. 4.2 x 3.5 x 3.9 cm
cyst with layering hemorrhage, benign.

Left ovary

Measurements: 3.3 x 1.8 x 3.0 cm = volume: 9 mL. Normal
appearance/no adnexal mass.

Pulsed Doppler evaluation of both ovaries demonstrates normal
low-resistance arterial and venous waveforms.

Other findings

Trace pelvic fluid, physiologic.
IMPRESSION: 4.2 cm benign hemorrhagic cyst in the right ovary. No follow-up
required.

No evidence of ovarian torsion.

## 2023-01-19 IMAGING — CT CT ABD-PELV W/O CM
2 of 4 series · 15 of 46 positions shown, 17 images · non-contrast
Comparison: Pelvic ultrasound yesterday, CT abdomen and pelvis
without contrast 10/12/2018.

CLINICAL DATA: Right lower quadrant pain.

EXAM:
CT ABDOMEN AND PELVIS WITHOUT CONTRAST
TECHNIQUE: Multidetector CT imaging of the abdomen and pelvis was performed
following the standard protocol without IV contrast.

[Series 2: routine abd/pel wo · axial · 0.98mm/px · z∈[-551,-71]mm · 12 of 106 slices shown, 14 images]
[im 5/106  soft-tissue]
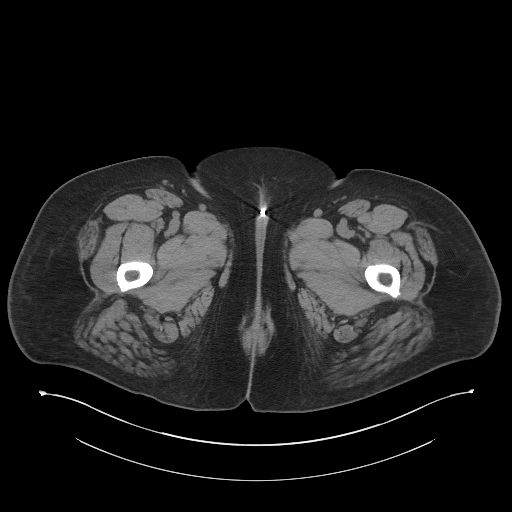
[im 5/106  bone]
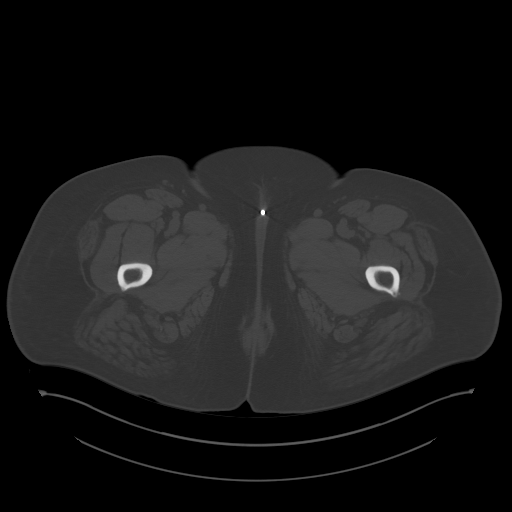
[im 15/106  soft-tissue]
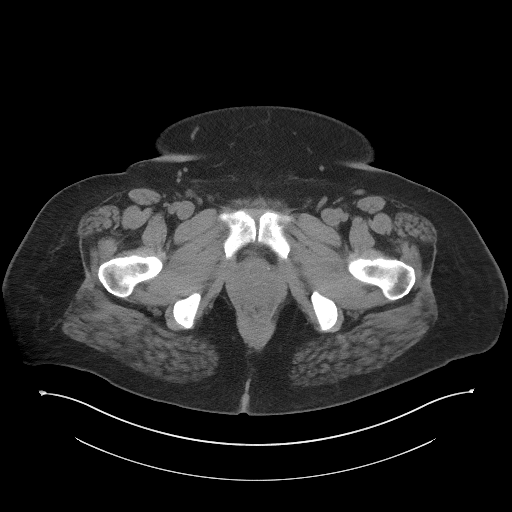
[im 24/106  soft-tissue]
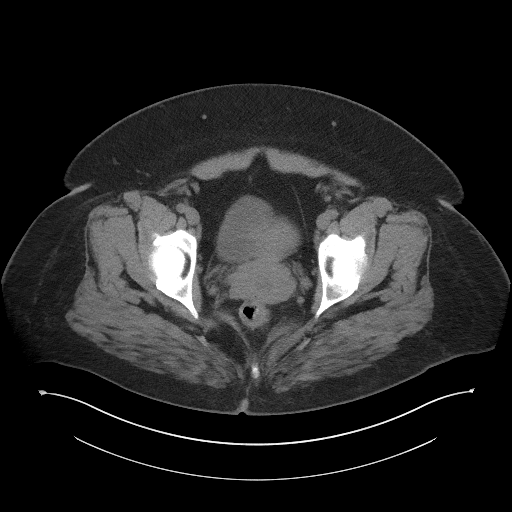
[im 34/106  soft-tissue]
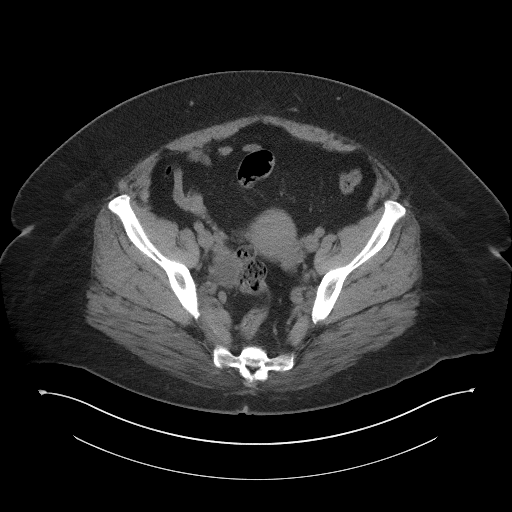
[im 39/106  soft-tissue]
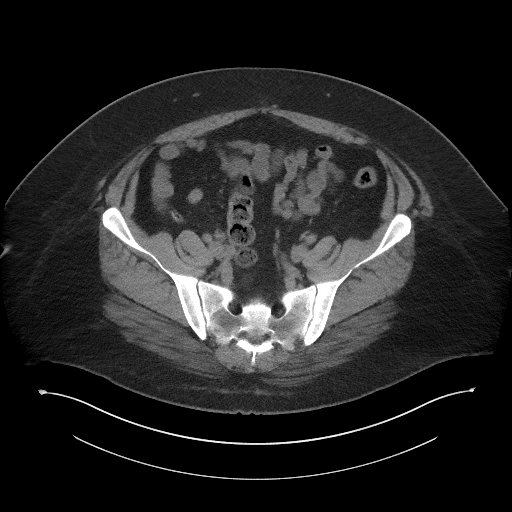
[im 48/106  soft-tissue]
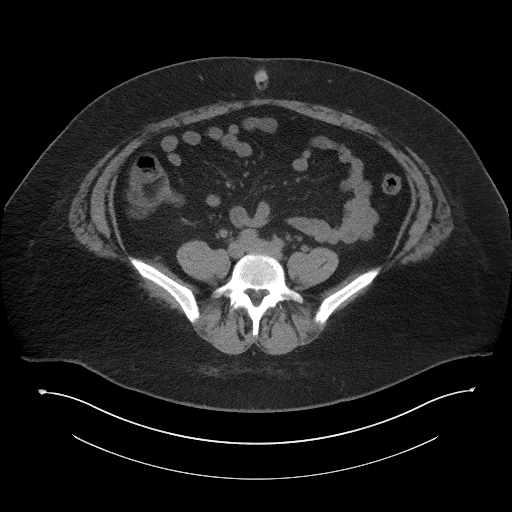
[im 58/106  soft-tissue]
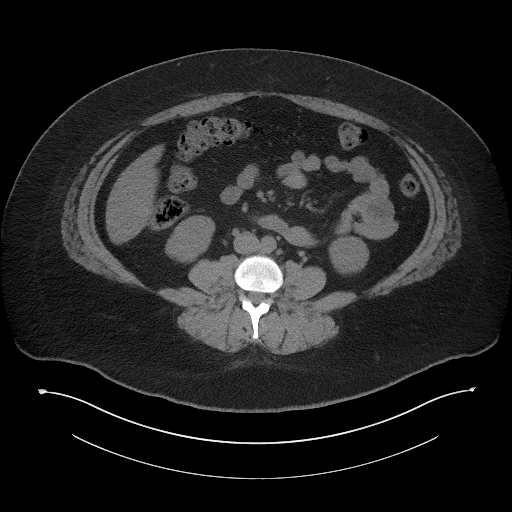
[im 67/106  soft-tissue]
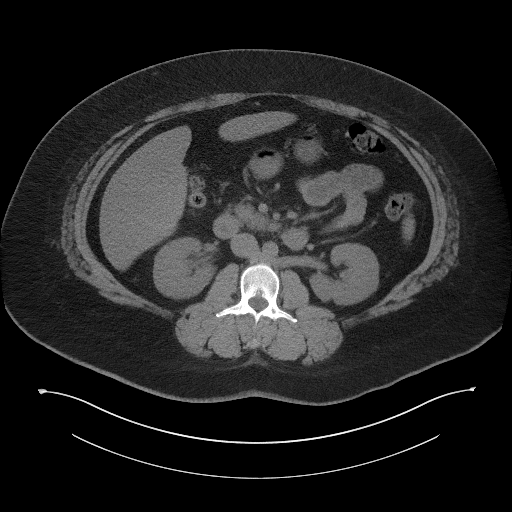
[im 72/106  soft-tissue]
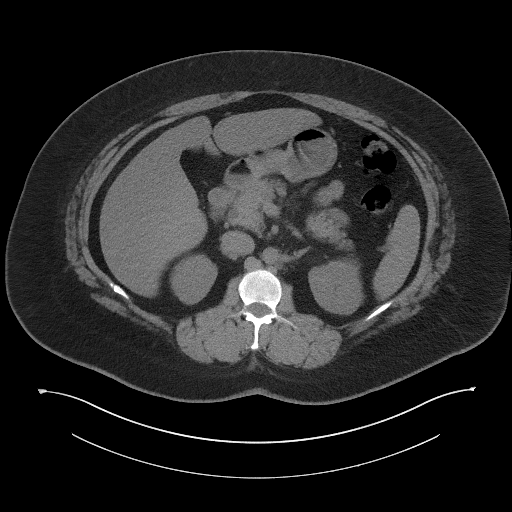
[im 72/106  bone]
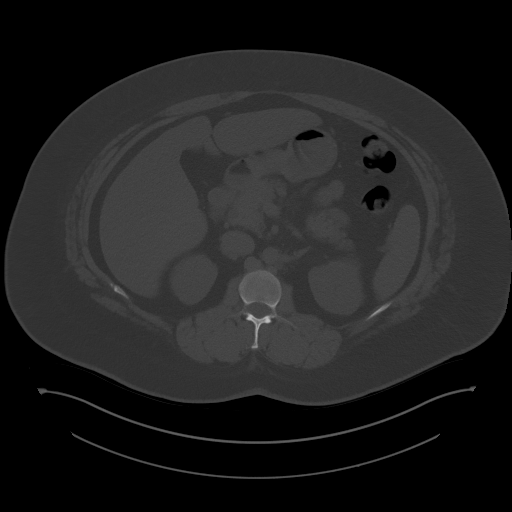
[im 82/106  soft-tissue]
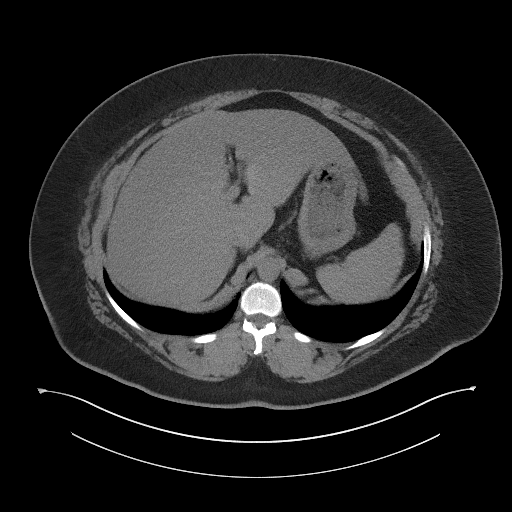
[im 91/106  soft-tissue]
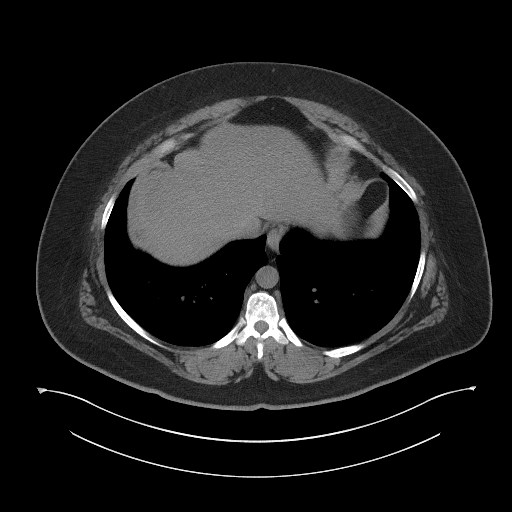
[im 101/106  soft-tissue]
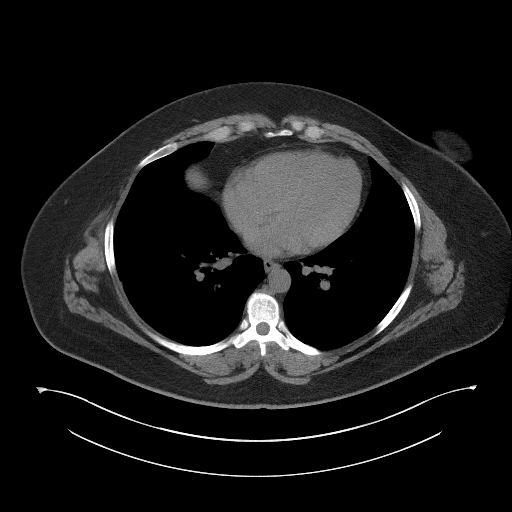

[Series 5: coronal st · coronal · 0.87mm/px · 3 of 117 slices shown]
[im 39/117  soft-tissue]
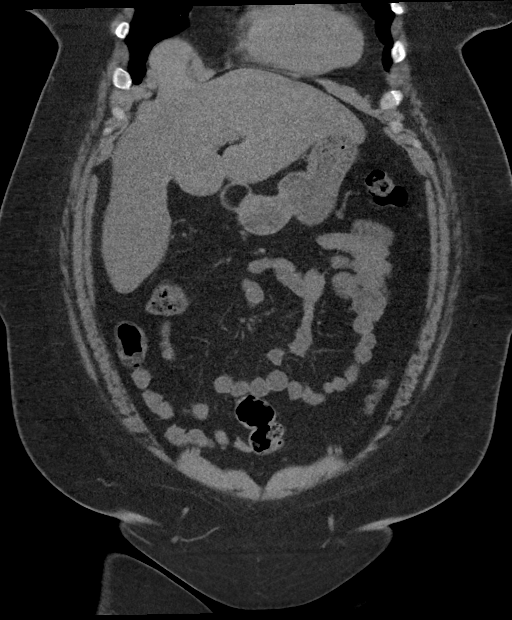
[im 52/117  soft-tissue]
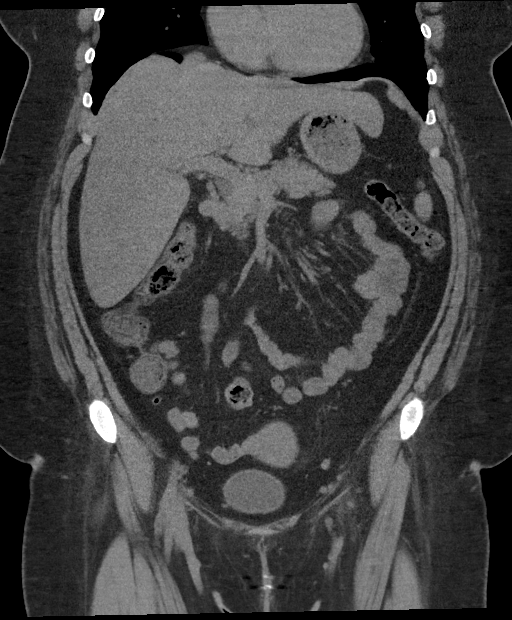
[im 65/117  soft-tissue]
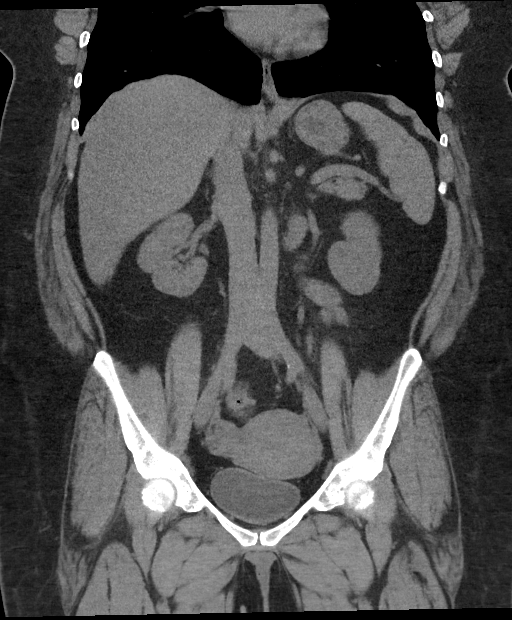

[15 of 46 positions shown; findings below may reference images not displayed]

FINDINGS: Lower chest: 3 mm stable chronic noncalcified right lower lobe
nodule, axial image 12. 4 mm pleural based right lower lobe nodule,
axial image 6 also unchanged.

Hepatobiliary: 23 cm in length with mild 2 moderate to hepatic
steatosis. No focal lesion is seen without contrast. Gallbladder is
absent with chronic common bile duct prominence to 10 mm

Pancreas: Unremarkable. No pancreatic ductal dilatation or
surrounding inflammatory changes.

Spleen: Normal in size without focal abnormality.

Adrenals/Urinary Tract: There are a few punctate nonobstructive
caliceal stones in the left kidney. No intrarenal stones seen on the
right. There is no ureteral stone or hydronephrosis either side, no
focal abnormality of unenhanced renal cortex and no adrenal mass.
The bladder wall is unremarkable.

Stomach/Bowel: There is no bowel obstruction or inflammation. Normal
appendix. Colonic diverticulosis without evidence of colitis or
diverticulitis

Vascular/Lymphatic: There are trace calcifications of the common
iliac arteries. The abdominal aorta is unremarkable without
contrast. There is a circumaortic left renal vein, slightly
prominent portal vein unchanged 15 mm

Reproductive: The uterus is intact. There is a tubal ligation clip
on the left. On the right, 4.5 cm cyst corresponding to the
hemorrhagic cyst noted on ultrasound is again shown. Left ovary is
unremarkable. There is a similar clip on the right anterior to the
distal liver which is probably the right BTL clip having migrated.
This was seen on the prior study as well.

Other: There is no free air, hemorrhage or fluid. Small umbilical
fat hernia. Small inguinal fat hernias

Musculoskeletal: There is advanced degenerative disc disease with
spondylosis at L5-S1.
IMPRESSION: 1. 4.5 cm right ovarian hemorrhagic cyst. No free fluid or free
hemorrhage in the pelvis. No other findings to suggest a further
etiology for right lower quadrant pain.
2. Enlarged liver with steatosis, with improvement in the fatty
replacement since the prior study.
3. Nonobstructive micronephrolithiasis.
4. Stable post cholecystectomy common bile duct prominence and
stable prominence of the hepatic portal vein.
5. Diverticulosis without diverticulitis, and additional findings
described above.

## 2023-04-15 ENCOUNTER — Other Ambulatory Visit: Payer: Self-pay | Admitting: Nurse Practitioner

## 2023-04-15 DIAGNOSIS — Z1231 Encounter for screening mammogram for malignant neoplasm of breast: Secondary | ICD-10-CM

## 2024-01-25 ENCOUNTER — Other Ambulatory Visit: Payer: Self-pay | Admitting: Nurse Practitioner

## 2024-01-25 DIAGNOSIS — Z1231 Encounter for screening mammogram for malignant neoplasm of breast: Secondary | ICD-10-CM

## 2024-02-23 ENCOUNTER — Ambulatory Visit: Admitting: Student in an Organized Health Care Education/Training Program
